# Patient Record
Sex: Female | Born: 1990 | Race: White | Hispanic: No | Marital: Single
Health system: Midwestern US, Community
[De-identification: ages and names within clinical notes are randomized; demographics above are authoritative.]

## PROBLEM LIST (undated history)

## (undated) DIAGNOSIS — N946 Dysmenorrhea, unspecified: Secondary | ICD-10-CM

## (undated) DIAGNOSIS — F1120 Opioid dependence, uncomplicated: Secondary | ICD-10-CM

## (undated) DIAGNOSIS — Z0000 Encounter for general adult medical examination without abnormal findings: Secondary | ICD-10-CM

## (undated) DIAGNOSIS — Z720 Tobacco use: Secondary | ICD-10-CM

## (undated) DIAGNOSIS — R002 Palpitations: Secondary | ICD-10-CM

## (undated) DIAGNOSIS — F1910 Other psychoactive substance abuse, uncomplicated: Secondary | ICD-10-CM

## (undated) DIAGNOSIS — F329 Major depressive disorder, single episode, unspecified: Principal | ICD-10-CM

## (undated) DIAGNOSIS — F419 Anxiety disorder, unspecified: Principal | ICD-10-CM

## (undated) HISTORY — DX: Dysmenorrhea, unspecified: N94.6

## (undated) HISTORY — DX: Anxiety disorder, unspecified: F41.9

## (undated) HISTORY — DX: Palpitations: R00.2

## (undated) HISTORY — DX: Encounter for general adult medical examination without abnormal findings: Z00.00

## (undated) HISTORY — DX: Tobacco use: Z72.0

## (undated) HISTORY — DX: Major depressive disorder, single episode, unspecified: F32.9

---
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
## 2012-01-08 ENCOUNTER — Other Ambulatory Visit: Payer: Self-pay | Admitting: Family Medicine

## 2012-01-09 ENCOUNTER — Telehealth: Payer: Self-pay | Admitting: Family Medicine

## 2012-01-09 DIAGNOSIS — G8929 Other chronic pain: Secondary | ICD-10-CM

## 2012-01-09 NOTE — Telephone Encounter (Signed)
OK to give refill on all 3 but not til the end of the month. She should not be out yet. Can fill any time after 10/29. If she is in that much pain and needs more she should consider coming back in for further evaluation and changes to meds or referral as needed

## 2012-01-09 NOTE — Telephone Encounter (Signed)
I left a detailed message on patients cell phone

## 2012-01-09 NOTE — Telephone Encounter (Signed)
Please advise refills? Oxy last RX was wrote on 12-20-11 quantity 20 with 0 refills, Flexeril  last RX was wrote on 12-20-11 quantity 30 with 0 refills, Mobic  last RX was wrote on 12-20-11 quantity 30 with 0 refills

## 2012-01-09 NOTE — Telephone Encounter (Signed)
Please advise Oxy, Flexeril, and Mobic refill? Last RX

## 2012-01-09 NOTE — Telephone Encounter (Signed)
Patient will pick up her oxycodone Rx on 01/15/12. Patient will pick up the muscle relaxer and the antiinflammatory at [facility] on the same day.

## 2012-01-14 MED ORDER — OXYCODONE-ACETAMINOPHEN 5-325 MG PO TABS: 1.0000 | ORAL_TABLET | Freq: Every day | ORAL | Status: DC | PRN

## 2012-01-14 NOTE — Telephone Encounter (Signed)
RX is printed and put up front

## 2012-01-15 MED ORDER — MELOXICAM 15 MG PO TABS: 15.0000 mg | ORAL_TABLET | Freq: Every day | ORAL | Status: DC

## 2012-01-15 MED ORDER — CYCLOBENZAPRINE HCL 10 MG PO TABS: ORAL_TABLET | ORAL | Status: DC

## 2012-01-15 NOTE — Addendum Note (Signed)
Addended by: Court Joy on: 01/15/2012 04:01 PM   Modules accepted: Orders

## 2012-01-21 ENCOUNTER — Ambulatory Visit (INDEPENDENT_AMBULATORY_CARE_PROVIDER_SITE_OTHER): Payer: BC Managed Care – HMO | Admitting: Family Medicine

## 2012-01-21 ENCOUNTER — Encounter: Payer: Self-pay | Admitting: Family Medicine

## 2012-01-21 ENCOUNTER — Telehealth: Payer: Self-pay | Admitting: Family Medicine

## 2012-01-21 ENCOUNTER — Other Ambulatory Visit: Payer: Self-pay | Admitting: Family Medicine

## 2012-01-21 VITALS — BP 111/75 | HR 65 | Temp 98.0°F | Ht 64.0 in | Wt 134.8 lb

## 2012-01-21 DIAGNOSIS — R002 Palpitations: Secondary | ICD-10-CM

## 2012-01-21 DIAGNOSIS — F329 Major depressive disorder, single episode, unspecified: Secondary | ICD-10-CM

## 2012-01-21 DIAGNOSIS — F341 Dysthymic disorder: Secondary | ICD-10-CM

## 2012-01-21 DIAGNOSIS — F418 Other specified anxiety disorders: Secondary | ICD-10-CM

## 2012-01-21 DIAGNOSIS — M549 Dorsalgia, unspecified: Secondary | ICD-10-CM

## 2012-01-21 MED ORDER — PAROXETINE HCL 20 MG PO TABS: 20.0000 mg | ORAL_TABLET | ORAL | Status: DC
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
## 2012-01-21 NOTE — Assessment & Plan Note (Signed)
Patient under increased stress lately, did initially get benefit from the Paroxetine but now needs it increased. Will try 20 mg daily. She has Alprazolam to use prn but she is reminded she can only use it very infrequently. She is accompanied by her grandmother today and her grandmother notes she has had recent personality changes with an episode of a very angry outburst last week. They are given the name of psychiatrist Dr Emerson Monte to seek further care from due to the family's concern.

## 2012-01-21 NOTE — Assessment & Plan Note (Signed)
Uses Meloxicam in am and Cyclobenzaprine qhs then needs Oxycodone infrequently. Only had a prescription for 20 tabs but unfortunately her mother called today concerned she may be taking the meds to excess or diverting them. Patient is asked to sign a drug contract and take a drug test today. Mom says some of the patient's brothers ADHD meds have gone missing recently. Drug screen will show if patient is taking Vyvanse when it is not prescribed to her. We will see her in follow up in 2 weeks or sooner as needed.

## 2012-01-21 NOTE — Telephone Encounter (Signed)
Patient's mother called concerned that the patient is possibly diverting her medications. Mom notes that she's had her other children's medications from other physicians go missing recently. By that is missing from both of her brothers prescriptions. She has become belligerent and argumentative lately. Mom is having concerns that she use either Sarna medications with her new boyfriend were taking him excessively. Patient has agreed to come in to discuss her medications this afternoon. We will have her do a drug test and have to stop all controlled substances at this time. She'll be offered a referral to orthopedics versus pain management if she needs further intervention.

## 2012-01-22 ENCOUNTER — Encounter: Payer: Self-pay | Admitting: Family Medicine

## 2012-01-22 ENCOUNTER — Telehealth: Payer: Self-pay | Admitting: Family Medicine

## 2012-01-22 DIAGNOSIS — R002 Palpitations: Secondary | ICD-10-CM

## 2012-01-22 HISTORY — DX: Palpitations: R00.2

## 2012-01-22 LAB — PRESCRIPTION ABUSE MONITORING 10P, URINE
Amphetamine/Meth: NEGATIVE ng/mL
Barbiturate Screen, Urine: NEGATIVE ng/mL
Benzodiazepine Screen, Urine: NEGATIVE ng/mL
Cannabinoid Scrn, Ur: NEGATIVE ng/mL
Creatinine, Urine: 96.13 mg/dL (ref >20.0)
Oxycodone Screen, Ur: NEGATIVE ng/mL

## 2012-01-22 NOTE — Telephone Encounter (Signed)
Message copied by Bradd Canary on Tue Jan 22, 2012  4:52 PM ------      Message from: Carmelia Bake      Created: Tue Jan 22, 2012  1:50 PM       Please call Andee Lineman [phone]. Advised patient that the physician does not typically contact patient.

## 2012-01-22 NOTE — Telephone Encounter (Signed)
Spoke with patient's mother, Heather Shepherd and reassured her that patient was cooperative with testing and signing a contract. We will continue to keep her involved as long as we are able and patient allows [place] to do so.

## 2012-01-22 NOTE — Assessment & Plan Note (Deleted)
No c/o at present time

## 2012-01-23 LAB — OPIATES/OPIOIDS (LC/MS-MS)
Codeine Urine: NEGATIVE ng/mL
Heroin (6-AM), UR: NEGATIVE ng/mL
Hydrocodone: 140 ng/mL — ABNORMAL HIGH
Norhydrocodone, Ur: 884 ng/mL — ABNORMAL HIGH

## 2012-01-23 NOTE — Telephone Encounter (Signed)
Left a message for pt to return my call. 

## 2012-01-23 NOTE — Telephone Encounter (Signed)
Patient called back and stated that she took a Xanax 1 day a week or two ago (doesn't take them like "that"), Oxy on the 29th and 30th, and pt states she told MD that she would see Vicodin on her drug test because she has taken some of those from an old prescription she has (these don't make her sleepy).

## 2012-01-23 NOTE — Progress Notes (Signed)
Quick Note:  Patient Informed and voiced understanding ______ 

## 2012-01-23 NOTE — Telephone Encounter (Signed)
thanks

## 2012-01-23 NOTE — Telephone Encounter (Signed)
Pt also stated she would bring her bottles back if she needs to.

## 2012-02-01 ENCOUNTER — Ambulatory Visit (INDEPENDENT_AMBULATORY_CARE_PROVIDER_SITE_OTHER): Payer: BC Managed Care – HMO | Admitting: Family Medicine

## 2012-02-01 ENCOUNTER — Encounter: Payer: Self-pay | Admitting: Family Medicine

## 2012-02-01 ENCOUNTER — Ambulatory Visit: Payer: BC Managed Care – HMO | Admitting: Family Medicine

## 2012-02-01 VITALS — BP 127/79 | HR 82 | Temp 98.6°F | Ht 64.0 in | Wt 135.4 lb

## 2012-02-01 DIAGNOSIS — M549 Dorsalgia, unspecified: Secondary | ICD-10-CM

## 2012-02-01 DIAGNOSIS — F341 Dysthymic disorder: Secondary | ICD-10-CM

## 2012-02-01 DIAGNOSIS — F419 Anxiety disorder, unspecified: Secondary | ICD-10-CM

## 2012-02-01 DIAGNOSIS — G8929 Other chronic pain: Secondary | ICD-10-CM

## 2012-02-01 MED ORDER — MELOXICAM 15 MG PO TABS: 15.0000 mg | ORAL_TABLET | Freq: Every day | ORAL | Status: DC

## 2012-02-01 NOTE — Patient Instructions (Signed)

## 2012-02-03 ENCOUNTER — Encounter: Payer: Self-pay | Admitting: Family Medicine

## 2012-02-03 NOTE — Progress Notes (Signed)
Patient ID: Hillari Ishida, female   DOB: 12/17/1990, 21 y.o.   MRN: [id number] Jemiya Fleurant [id number] 05/24/1990 02/03/2012      Progress Note-Follow Up  Subjective  Chief Complaint  Chief Complaint  Patient presents with  . Follow-up    6 week    HPI  Patient is a 21 year old Caucasian female who is in today accompanied by her mother. They have been able to pass things up and she is back home. She continues to struggle with chronic back pain but finds it tolerable the moment. Is using very few medications besides her anti-inflammatory. Also struggles with anxiety and depression which runs strongly in her family. Mom and grandmother are both being treated with medications. She did not pick up her increase Paroxetine dosing at this point due to financial reasons but is going to today. No new acute complaints. No recent illness, chest pain, palpitations, shortness of breath, anxiety attacks, GI or GU complaints. She is using her Oxycodone very infrequently and has recently gotten a new puppy whom she reports ate several of her meds facilitating a trip to the vet but no major health concerns identified. Is not asking for futher refills today  Past Medical History  Diagnosis Date  . Dysmenorrhea   . Anxiety and depression 11/08/2011  . Preventative health care 11/11/2011  . Tobacco abuse 11/11/2011  . Palpitations 01/22/2012    No past surgical history on file.  Family History  Problem Relation Age of Onset  . Hyperlipidemia Mother   . Hyperlipidemia Father   . Diabetes Maternal Grandmother   . Heart disease Maternal Grandfather     triple bypass  . Hyperlipidemia Maternal Grandfather   . Hypertension Maternal Grandfather   . Heart attack Maternal Grandfather     History   Social History  . Marital Status: Single    Spouse Name: N/A    Number of Children: N/A  . Years of Education: N/A   Occupational History  . Not on file.   Social History Main Topics  . Smoking  status: Current Every Day Smoker -- 1.0 packs/day for 2 years  . Smokeless tobacco: Never Used  . Alcohol Use: Yes     Comment: occasionally  . Drug Use: No  . Sexually Active: Yes -- Female partner(s)   Other Topics Concern  . Not on file   Social History Narrative  . No narrative on file    Current Outpatient Prescriptions on File Prior to Visit  Medication Sig Dispense Refill  . acetaminophen (TYLENOL) 500 MG tablet Take 1,500 mg by mouth every 6 (six) hours as needed. For pain       . cyclobenzaprine (FLEXERIL) 10 MG tablet 1/2 to 1 tab by mouth at bedtime as needed for pain  30 tablet  0  . oxyCODONE-acetaminophen (ROXICET) 5-325 MG per tablet Take 1 tablet by mouth daily as needed for pain.  20 tablet  0  . PARoxetine (PAXIL) 20 MG tablet Take 1 tablet (20 mg total) by mouth every morning.  30 tablet  1    Allergies  Allergen Reactions  . Ibuprofen Hives    Review of Systems  Review of Systems  Constitutional: Negative for fever and malaise/fatigue.  HENT: Negative for congestion.   Eyes: Negative for discharge.  Respiratory: Negative for shortness of breath.   Cardiovascular: Negative for chest pain, palpitations and leg swelling.  Gastrointestinal: Negative for nausea, abdominal pain and diarrhea.  Genitourinary: Negative for dysuria.  Musculoskeletal: Positive for back  pain. Negative for falls.  Skin: Negative for rash.  Neurological: Negative for loss of consciousness and headaches.  Endo/Heme/Allergies: Negative for polydipsia.  Psychiatric/Behavioral: Positive for depression. Negative for suicidal ideas. The patient is nervous/anxious. The patient does not have insomnia.     Objective  BP 127/79  Pulse 82  Temp 98.6 F (37 C) (Temporal)  Ht 5\' 4"  (1.626 m)  Wt 135 lb 6.4 oz (61.417 kg)  BMI 23.24 kg/m2  SpO2 99%  LMP 01/07/2012  Physical Exam  Physical Exam  Constitutional: She is oriented to person, place, and time and well-developed,  well-nourished, and in no distress. No distress.  HENT:  Head: Normocephalic and atraumatic.  Eyes: Conjunctivae normal are normal.  Neck: Neck supple. No thyromegaly present.  Cardiovascular: Normal rate, regular rhythm and normal heart sounds.   No murmur heard. Pulmonary/Chest: Effort normal and breath sounds normal. She has no wheezes.  Abdominal: She exhibits no distension and no mass.  Musculoskeletal: She exhibits no edema.  Lymphadenopathy:    She has no cervical adenopathy.  Neurological: She is alert and oriented to person, place, and time.  Skin: Skin is warm and dry. No rash noted. She is not diaphoretic.  Psychiatric: Memory, affect and judgment normal.     Assessment & Plan  Back pain Doing well, Not requesting any refills on pain meds. Has passed drug test and is encouraged to consider Yoga and stretching.   Anxiety and depression Has not increased her Paroxetine to 20 mg daily but agrees to do so now.

## 2012-02-03 NOTE — Assessment & Plan Note (Signed)
Has not increased her Paroxetine to 20 mg daily but agrees to do so now.

## 2012-02-03 NOTE — Assessment & Plan Note (Signed)
Doing well, Not requesting any refills on pain meds. Has passed drug test and is encouraged to consider Yoga and stretching.

## 2012-02-11 ENCOUNTER — Other Ambulatory Visit: Payer: Self-pay

## 2012-02-11 DIAGNOSIS — F418 Other specified anxiety disorders: Secondary | ICD-10-CM

## 2012-02-18 ENCOUNTER — Telehealth: Payer: Self-pay | Admitting: Family Medicine

## 2012-02-18 DIAGNOSIS — M549 Dorsalgia, unspecified: Secondary | ICD-10-CM

## 2012-02-18 MED ORDER — CYCLOBENZAPRINE HCL 10 MG PO TABS: ORAL_TABLET | ORAL | Status: DC

## 2012-02-18 MED ORDER — OXYCODONE-ACETAMINOPHEN 5-325 MG PO TABS: 1.0000 | ORAL_TABLET | Freq: Every day | ORAL | Status: DC | PRN

## 2012-02-18 MED ORDER — MELOXICAM 15 MG PO TABS: 15.0000 mg | ORAL_TABLET | Freq: Every day | ORAL | Status: DC

## 2012-02-18 NOTE — Telephone Encounter (Signed)
Patient came in to pick these up

## 2012-02-18 NOTE — Telephone Encounter (Signed)
It looks like all of her meds except her Paxil are due anyway, has not had most of them since October. She can have the Flexeril (which is the muscle relaxer). 1 tab po daily prn pain, can cause sedation, #30 w/o rf til seen in follow up for Paxil change and can have Meloxicam 15 mg 1 tab po daily prn pain with food, #30 3 rf. Oxycodone refill with same sig, Disp #15 but remind her I expect this to last a while because this is a med only to use when in severe pain

## 2012-02-18 NOTE — Telephone Encounter (Signed)
Please advise 

## 2012-02-18 NOTE — Telephone Encounter (Signed)
Patient's dog ate her medicine, she mentioned it to Dr Abner Greenspan in her last OV. Patient needs refills of her muscle relaxer, oxycodone, flexeril. She is off work today so she can come by the office to pick it up. Please call when ready.

## 2012-02-25 ENCOUNTER — Ambulatory Visit: Payer: BC Managed Care – HMO | Admitting: Family Medicine

## 2012-04-02 ENCOUNTER — Telehealth: Payer: Self-pay

## 2012-04-02 NOTE — Telephone Encounter (Signed)
Pt left a message to return her call. I left a message at [phone] to return my call

## 2012-04-03 ENCOUNTER — Other Ambulatory Visit: Payer: Self-pay | Admitting: Family Medicine

## 2012-04-03 DIAGNOSIS — F418 Other specified anxiety disorders: Secondary | ICD-10-CM

## 2012-04-03 DIAGNOSIS — G8929 Other chronic pain: Secondary | ICD-10-CM

## 2012-04-03 NOTE — Telephone Encounter (Signed)
Patient is out of all of her meds. Advised patient that Rx takes 48 hours. She has promised to call 1 week in advance in future. Please call patient's boyfriends cell when ready to pu.

## 2012-04-04 MED ORDER — MELOXICAM 15 MG PO TABS: 15.0000 mg | ORAL_TABLET | Freq: Every day | ORAL | Status: DC

## 2012-04-04 MED ORDER — OXYCODONE-ACETAMINOPHEN 5-325 MG PO TABS: 1.0000 | ORAL_TABLET | Freq: Every day | ORAL | Status: DC | PRN

## 2012-04-04 MED ORDER — PAROXETINE HCL 20 MG PO TABS: 20.0000 mg | ORAL_TABLET | ORAL | Status: DC

## 2012-04-04 MED ORDER — CYCLOBENZAPRINE HCL 10 MG PO TABS: ORAL_TABLET | ORAL | Status: DC

## 2012-04-04 NOTE — Telephone Encounter (Signed)
Please advise refills?  Oxycodone last RX was 02-18-12 quantity 15 with 0 refills Flexeril  last RX was 02-18-12 quantity 30 with 0 refills Meloxicam  last RX was 02-18-12 quantity 30 with 3 refills? Pt should have refills? Paxil  last RX was 01-31-12 quantity 30 with 1 refills

## 2012-05-14 ENCOUNTER — Telehealth: Payer: Self-pay | Admitting: Family Medicine

## 2012-05-14 NOTE — Telephone Encounter (Signed)
She can have refill on Oxy and Flexeril

## 2012-05-14 NOTE — Telephone Encounter (Signed)
Please advise refills.  Last RX for Meloxicam was- 04-04-12 quantity 30 with 3 refills-- NOT TIME FOR REFILL  Last RX for Flexeril was 04-04-12 quantity 30 with 0 refill  Last RX for oxy was 04-04-12 quantity 15 with 0 refills

## 2012-05-15 MED ORDER — CYCLOBENZAPRINE HCL 10 MG PO TABS: ORAL_TABLET | ORAL | Status: DC

## 2012-05-15 MED ORDER — OXYCODONE-ACETAMINOPHEN 5-325 MG PO TABS: 1.0000 | ORAL_TABLET | Freq: Every day | ORAL | Status: DC | PRN

## 2012-05-15 NOTE — Telephone Encounter (Signed)
RX's printed and pt informed RX's are at [facility] office

## 2012-05-30 ENCOUNTER — Ambulatory Visit: Payer: BC Managed Care – HMO | Admitting: Family Medicine

## 2012-06-10 ENCOUNTER — Telehealth: Payer: Self-pay

## 2012-06-10 NOTE — Telephone Encounter (Signed)
Patients mom called asking where to take pt for detox? I informed pt to go to [facility] and checked with MD and she verbally agreed. Pts mother states she is getting ready to take her.

## 2012-06-16 ENCOUNTER — Telehealth: Payer: Self-pay | Admitting: Family Medicine

## 2012-06-16 NOTE — Telephone Encounter (Signed)
Please contact patient's mother Tues 06/17/12. Would not give any details as to why she is calling.

## 2012-06-17 NOTE — Telephone Encounter (Signed)
Spoke with patient's mother and informed that we need more general information to forward to provider as to what this is about, as provider is occupied with her patients in the office during regular business hours, caller refused, stating that she would like to speak directly w/provider as this was "a Personnel officer". Informed that message would be forwarded to provider/SLS

## 2012-07-01 NOTE — Telephone Encounter (Signed)
Patients mom called back saying that she wanted to inform Dr. 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 Mom says that patient is in a facility in [facility] and has been there since 06/13/12. She says that this is a 35 day program. Patients mom would like a callback regarding this.

## 2012-07-01 NOTE — Telephone Encounter (Signed)
Patient informed provider out of office until Thurs, 04.16.14, understood & agreed to wait on provider call/SLS

## 2012-07-02 ENCOUNTER — Other Ambulatory Visit: Payer: Self-pay | Admitting: Family Medicine

## 2012-07-02 NOTE — Telephone Encounter (Signed)
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 She is now in a rehab center in [place] for 35 days and then will return home for outpatient treatment. I have removed Oxycodone and Cyclobenzaprine from her medicaiton list so she cannot get refills

## 2012-07-18 ENCOUNTER — Telehealth: Payer: Self-pay

## 2012-07-18 NOTE — Telephone Encounter (Signed)
I called pts mother per MD's request to give information about the [facility] house in [place] for pts treatment after getting back from [place].  [facility]- [address] [place], [place] [address] [phone] ask for Faunt[facility].  [facility]- [address] [place], [place] [address] [phone].  If any questions pt can call [place] at [phone]

## 2014-01-24 ENCOUNTER — Emergency Department: Admit: 2014-01-24

## 2014-01-24 ENCOUNTER — Inpatient Hospital Stay: Admit: 2014-01-24 | Discharge: 2014-01-24

## 2014-01-24 MED ORDER — CYCLOBENZAPRINE HCL 10 MG PO TABS
10 MG | ORAL_TABLET | Freq: Three times a day (TID) | ORAL | Status: AC | PRN
Start: 2014-01-24 — End: 2014-01-31

## 2014-01-24 NOTE — ED Notes (Signed)
PT discharged at this time.  PT given instructions for follow up and prescriptions.  PT denies any needs or questions.  PT taken to discharge office.      Nira RetortLaney Beckett, RN  01/24/14 1504

## 2014-01-24 NOTE — ED Notes (Signed)
Bed: [facility] 04  Expected date:   Expected time:   Means of arrival:   Comments:
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
## 2015-05-09 DIAGNOSIS — F1111 Opioid abuse, in remission: Secondary | ICD-10-CM | POA: Insufficient documentation

## 2017-03-18 ENCOUNTER — Telehealth (HOSPITAL_COMMUNITY): Payer: Self-pay | Admitting: Licensed Clinical Social Worker

## 2017-03-18 NOTE — Telephone Encounter (Signed)
Called and spoke w/ guardian about pt starting CD-IOP when [facility].

## 2017-04-03 ENCOUNTER — Telehealth (HOSPITAL_COMMUNITY): Payer: Self-pay | Admitting: Licensed Clinical Social Worker

## 2017-04-03 NOTE — Telephone Encounter (Signed)
Counselor returned call from pt and pt scheduled CCA w/ desire to enter CD-IOP on Monday 1/21.

## 2017-04-05 ENCOUNTER — Ambulatory Visit: Payer: Self-pay | Admitting: Family Medicine

## 2017-04-05 DIAGNOSIS — Z0289 Encounter for other administrative examinations: Secondary | ICD-10-CM

## 2017-04-08 ENCOUNTER — Ambulatory Visit (HOSPITAL_COMMUNITY): Payer: Self-pay | Admitting: Licensed Clinical Social Worker

## 2017-04-24 ENCOUNTER — Ambulatory Visit (INDEPENDENT_AMBULATORY_CARE_PROVIDER_SITE_OTHER): Payer: BLUE CROSS/BLUE SHIELD | Admitting: Licensed Clinical Social Worker

## 2017-04-24 DIAGNOSIS — F1020 Alcohol dependence, uncomplicated: Secondary | ICD-10-CM | POA: Diagnosis not present

## 2017-04-24 DIAGNOSIS — F1120 Opioid dependence, uncomplicated: Secondary | ICD-10-CM

## 2017-04-25 ENCOUNTER — Encounter (HOSPITAL_COMMUNITY): Payer: Self-pay | Admitting: Licensed Clinical Social Worker
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
## 2017-05-06 ENCOUNTER — Telehealth (HOSPITAL_COMMUNITY): Payer: Self-pay | Admitting: Licensed Clinical Social Worker

## 2017-05-06 ENCOUNTER — Telehealth (HOSPITAL_COMMUNITY): Payer: Self-pay

## 2017-05-06 NOTE — Telephone Encounter (Signed)
Patient called stating that she had been waiting to hear back concerning IOP. Please call and advise.  Thanks

## 2017-05-06 NOTE — Telephone Encounter (Signed)
Advised pt to sign a ROI at her Suboxone provider [facility] w/ Kip Corrington to allow CD-IOP to speak w/ them concerning her upcoming admission in CD-IOP. Pt verbalized understanding and will contact this writer when ROI is complete.

## 2017-05-07 ENCOUNTER — Telehealth (HOSPITAL_COMMUNITY): Payer: Self-pay | Admitting: Psychology

## 2017-07-17 ENCOUNTER — Emergency Department (HOSPITAL_BASED_OUTPATIENT_CLINIC_OR_DEPARTMENT_OTHER)
Admission: EM | Admit: 2017-07-17 | Discharge: 2017-07-17 | Disposition: A | Discharge status: Home / Self Care | Payer: Worker's Compensation | Attending: Emergency Medicine | Admitting: Emergency Medicine

## 2017-07-17 ENCOUNTER — Encounter (HOSPITAL_BASED_OUTPATIENT_CLINIC_OR_DEPARTMENT_OTHER): Payer: Self-pay | Admitting: Emergency Medicine

## 2017-07-17 ENCOUNTER — Other Ambulatory Visit: Payer: Self-pay

## 2017-07-17 DIAGNOSIS — S71011A Laceration without foreign body, right hip, initial encounter: Secondary | ICD-10-CM | POA: Insufficient documentation

## 2017-07-17 DIAGNOSIS — Y288XXA Contact with other sharp object, undetermined intent, initial encounter: Secondary | ICD-10-CM | POA: Diagnosis not present

## 2017-07-17 DIAGNOSIS — S76021A Laceration of muscle, fascia and tendon of right hip, initial encounter: Secondary | ICD-10-CM | POA: Diagnosis not present

## 2017-07-17 DIAGNOSIS — Y990 Civilian activity done for income or pay: Secondary | ICD-10-CM | POA: Diagnosis not present

## 2017-07-17 DIAGNOSIS — Z79899 Other long term (current) drug therapy: Secondary | ICD-10-CM | POA: Diagnosis not present

## 2017-07-17 DIAGNOSIS — F17210 Nicotine dependence, cigarettes, uncomplicated: Secondary | ICD-10-CM | POA: Insufficient documentation

## 2017-07-17 DIAGNOSIS — S79921A Unspecified injury of right thigh, initial encounter: Secondary | ICD-10-CM | POA: Diagnosis present

## 2017-07-17 DIAGNOSIS — Y9289 Other specified places as the place of occurrence of the external cause: Secondary | ICD-10-CM | POA: Diagnosis not present

## 2017-07-17 DIAGNOSIS — Y9389 Activity, other specified: Secondary | ICD-10-CM | POA: Insufficient documentation

## 2017-07-17 HISTORY — DX: Opioid dependence, uncomplicated: F11.20

## 2017-07-17 HISTORY — DX: Other psychoactive substance abuse, uncomplicated: F19.10

## 2017-07-17 MED ORDER — LIDOCAINE-EPINEPHRINE-TETRACAINE (LET) SOLUTION
3.0000 mL | Freq: Once | NASAL | Status: AC
  Administered 2017-07-17: 3 mL via TOPICAL
  Filled 2017-07-17: qty 3

## 2017-07-17 NOTE — ED Triage Notes (Signed)
Pt has co-worker with her and they are waiting to hear from supervisor to see if pt needs UDS for workman's comp. Will inform staff.

## 2017-07-17 NOTE — ED Provider Notes (Signed)
[facility] EMERGENCY DEPARTMENT Provider Note   CSN: [id number] Arrival date & time: 07/17/17  0127     History   Chief Complaint Chief Complaint  Patient presents with  . Extremity Laceration    HPI Heather Shepherd is a 27 y.o. female.  The history is provided by the patient.  Laceration   The incident occurred 3 to 5 hours ago. Pain location: right proximal thigh. The laceration is 3 cm in size. The laceration mechanism was a a metal edge. The pain is moderate. The pain has been constant since onset. She reports no foreign bodies present. Her tetanus status is UTD.  Box cutter visualized intact.  Past Medical History:  Diagnosis Date  . Anxiety and depression 11/08/2011  . Dysmenorrhea   . Opioid dependence on agonist therapy (HCC)   . Palpitations 01/22/2012  . Preventative health care 11/11/2011  . Tobacco abuse 11/11/2011    Patient Active Problem List   Diagnosis Date Noted  . Back pain 12/24/2011  . Preventative health care 11/11/2011  . Tobacco abuse 11/11/2011  . Anxiety and depression 11/08/2011  . Dysmenorrhea     History reviewed. No pertinent surgical history.   OB History   None      Home Medications    Prior to Admission medications   Medication Sig Start Date End Date Taking? Authorizing Provider  buprenorphine-naloxone (SUBOXONE) 8-2 mg SUBL SL tablet Place 2 tablets under the tongue daily.   Yes [provider]  mirtazapine (REMERON) 30 MG tablet Take 30 mg by mouth at bedtime.   Yes [provider]  venlafaxine XR (EFFEXOR-XR) 150 MG 24 hr capsule Take 150 mg by mouth daily with breakfast.   Yes [provider]    Family History Family History  Problem Relation Age of Onset  . Hyperlipidemia Mother   . Hyperlipidemia Father   . Diabetes Maternal Grandmother   . Heart disease Maternal Grandfather        triple bypass  . Hyperlipidemia Maternal Grandfather   . Hypertension Maternal Grandfather   .  Heart attack Maternal Grandfather     Social History Social History   Tobacco Use  . Smoking status: Current Every Day Smoker    Packs/day: 1.00    Years: 2.00    Pack years: 2.00  . Smokeless tobacco: Never Used  Substance Use Topics  . Alcohol use: Yes    Comment: occasionally  . Drug use: No     Allergies   Ibuprofen   Review of Systems Review of Systems  Constitutional: Negative for fever.  Respiratory: Negative for shortness of breath.   Cardiovascular: Negative for chest pain.  Gastrointestinal: Negative for constipation.  Skin: Positive for wound.  All other systems reviewed and are negative.    Physical Exam Updated Vital Signs BP 131/72 (BP Location: Left Arm)   Pulse 73   Temp 98.3 F (36.8 C) (Oral)   Resp 18   Ht  (1.626 m)   Wt 79.4 kg (175 lb)   SpO2 98%   BMI 30.04 kg/m   Physical Exam  Constitutional: She is oriented to person, place, and time. She appears well-developed and well-nourished. No distress.  HENT:  Head: Normocephalic and atraumatic.  Mouth/Throat: No oropharyngeal exudate.  Eyes: Conjunctivae and EOM are normal.  Neck: Normal range of motion. Neck supple.  Cardiovascular: Normal rate, regular rhythm, normal heart sounds and intact distal pulses.  Pulmonary/Chest: Effort normal and breath sounds normal. No stridor. She  has no wheezes. She has no rales.  Abdominal: Soft. Bowel sounds are normal. She exhibits no mass. There is no tenderness. There is no guarding.  Musculoskeletal: Normal range of motion.  Neurological: She is alert and oriented to person, place, and time. She displays normal reflexes.  Skin: Skin is warm and dry. Capillary refill takes less than 2 seconds.     Psychiatric: She has a normal mood and affect.     ED Treatments / Results  Labs (all labs ordered are listed, but only abnormal results are displayed) Labs Reviewed - No data to display  EKG None  Radiology No results  found.  Procedures .Marland KitchenLaceration Repair Date/Time: 07/17/2017 4:06 AM Performed by: Cy Blamer, MD Authorized by: Cy Blamer, MD   Consent:    Consent obtained:  Verbal   Consent given by:  Patient   Risks discussed:  Infection, need for additional repair, nerve damage, poor wound healing, poor cosmetic result, pain, retained foreign body, tendon damage and vascular damage   Alternatives discussed:  No treatment Anesthesia (see MAR for exact dosages):    Anesthesia method:  Topical application   Topical anesthetic:  LET Laceration details:    Location: right thigh medial.   Length (cm):  3   Depth (mm):  1 Repair type:    Repair type:  Simple Pre-procedure details:    Preparation:  Patient was prepped and draped in usual sterile fashion Exploration:    Hemostasis achieved with:  Direct pressure   Wound exploration: wound explored through full range of motion     Wound extent: no areolar tissue violation noted, no fascia violation noted, no foreign bodies/material noted, no muscle damage noted, no nerve damage noted, no tendon damage noted, no underlying fracture noted and no vascular damage noted     Contaminated: no   Treatment:    Area cleansed with:  Betadine and Hibiclens   Amount of cleaning:  Standard   Irrigation solution:  Sterile saline Skin repair:    Repair method:  Staples   Number of staples:  4 Approximation:    Approximation:  Close Post-procedure details:    Dressing:  Antibiotic ointment and sterile dressing   Patient tolerance of procedure:  Tolerated well, no immediate complications   (including critical care time)  Medications Ordered in ED Medications  lidocaine-EPINEPHrine-tetracaine (LET) solution (3 mLs Topical Given 07/17/17 0313)      Final Clinical Impressions(s) / ED Diagnoses   Final diagnoses:  Thigh laceration involving tendon, right, initial encounter    Staple removal in 10 days at urgent care.  Wound care instructions  given.    Return for weakness, numbness, changes in vision or speech, fevers >100.4 unrelieved by medication, shortness of breath, intractable vomiting, or diarrhea, abdominal pain, Inability to tolerate liquids or food, cough, altered mental status or any concerns. No signs of systemic illness or infection. The patient is nontoxic-appearing on exam and vital signs are within normal limits.   I have reviewed the triage vital signs and the nursing notes. Pertinent labs &imaging results that were available during my care of the patient were reviewed by me and considered in my medical decision making (see chart for details).  After history, exam, and medical workup I feel the patient has been appropriately medically screened and is safe for discharge home. Pertinent diagnoses were discussed with the patient. Patient was given return precautions.     Lakitha Gordy, MD 07/17/17 1191

## 2017-07-17 NOTE — ED Triage Notes (Signed)
Pt has laceration from right thigh from box cutter at work.

## 2017-07-17 NOTE — ED Triage Notes (Signed)
Pt has dressing on wound, bleeding controlled.

## 2017-07-17 NOTE — ED Notes (Signed)
Pt has 1.5 inch diagonal laceration to right inner thigh, well approximated, dressing changed and reapplied

## 2021-01-13 ENCOUNTER — Inpatient Hospital Stay (HOSPITAL_COMMUNITY)
Admission: EM | Admit: 2021-01-13 | Discharge: 2021-01-17 | DRG: 894 | Discharge status: Against Medical Advice | Payer: Medicaid Other | Attending: Pulmonary Disease | Admitting: Pulmonary Disease

## 2021-01-13 ENCOUNTER — Emergency Department (HOSPITAL_COMMUNITY): Payer: Medicaid Other

## 2021-01-13 ENCOUNTER — Encounter (HOSPITAL_COMMUNITY): Payer: Self-pay | Admitting: Emergency Medicine

## 2021-01-13 DIAGNOSIS — E876 Hypokalemia: Secondary | ICD-10-CM | POA: Diagnosis present

## 2021-01-13 DIAGNOSIS — F1123 Opioid dependence with withdrawal: Secondary | ICD-10-CM | POA: Diagnosis present

## 2021-01-13 DIAGNOSIS — F1193 Opioid use, unspecified with withdrawal: Secondary | ICD-10-CM

## 2021-01-13 DIAGNOSIS — F10939 Alcohol use, unspecified with withdrawal, unspecified: Secondary | ICD-10-CM | POA: Diagnosis present

## 2021-01-13 DIAGNOSIS — F1910 Other psychoactive substance abuse, uncomplicated: Secondary | ICD-10-CM | POA: Diagnosis present

## 2021-01-13 DIAGNOSIS — R7401 Elevation of levels of liver transaminase levels: Secondary | ICD-10-CM | POA: Diagnosis present

## 2021-01-13 DIAGNOSIS — F1990 Other psychoactive substance use, unspecified, uncomplicated: Secondary | ICD-10-CM | POA: Diagnosis present

## 2021-01-13 DIAGNOSIS — F10231 Alcohol dependence with withdrawal delirium: Principal | ICD-10-CM | POA: Diagnosis present

## 2021-01-13 DIAGNOSIS — E871 Hypo-osmolality and hyponatremia: Secondary | ICD-10-CM | POA: Diagnosis not present

## 2021-01-13 DIAGNOSIS — D7589 Other specified diseases of blood and blood-forming organs: Secondary | ICD-10-CM | POA: Diagnosis present

## 2021-01-13 DIAGNOSIS — F10931 Alcohol use, unspecified with withdrawal delirium: Secondary | ICD-10-CM

## 2021-01-13 DIAGNOSIS — Z8249 Family history of ischemic heart disease and other diseases of the circulatory system: Secondary | ICD-10-CM

## 2021-01-13 DIAGNOSIS — Z20822 Contact with and (suspected) exposure to covid-19: Secondary | ICD-10-CM | POA: Diagnosis present

## 2021-01-13 DIAGNOSIS — F17210 Nicotine dependence, cigarettes, uncomplicated: Secondary | ICD-10-CM | POA: Diagnosis present

## 2021-01-13 DIAGNOSIS — D72819 Decreased white blood cell count, unspecified: Secondary | ICD-10-CM | POA: Diagnosis present

## 2021-01-13 DIAGNOSIS — D6959 Other secondary thrombocytopenia: Secondary | ICD-10-CM | POA: Diagnosis present

## 2021-01-13 DIAGNOSIS — R079 Chest pain, unspecified: Secondary | ICD-10-CM

## 2021-01-13 DIAGNOSIS — D539 Nutritional anemia, unspecified: Secondary | ICD-10-CM | POA: Diagnosis present

## 2021-01-13 DIAGNOSIS — Z833 Family history of diabetes mellitus: Secondary | ICD-10-CM

## 2021-01-13 DIAGNOSIS — Z5329 Procedure and treatment not carried out because of patient's decision for other reasons: Secondary | ICD-10-CM | POA: Diagnosis present

## 2021-01-13 DIAGNOSIS — I959 Hypotension, unspecified: Secondary | ICD-10-CM | POA: Diagnosis present

## 2021-01-13 DIAGNOSIS — Z886 Allergy status to analgesic agent status: Secondary | ICD-10-CM

## 2021-01-13 DIAGNOSIS — Z781 Physical restraint status: Secondary | ICD-10-CM

## 2021-01-13 DIAGNOSIS — Z83438 Family history of other disorder of lipoprotein metabolism and other lipidemia: Secondary | ICD-10-CM

## 2021-01-13 LAB — RESP PANEL BY RT-PCR (FLU A&B, COVID) ARPGX2
Influenza A by PCR: NEGATIVE
Influenza B by PCR: NEGATIVE
SARS Coronavirus 2 by RT PCR: NEGATIVE

## 2021-01-13 LAB — COMPREHENSIVE METABOLIC PANEL
ALT: 95 U/L — ABNORMAL HIGH (ref 0–44)
AST: 240 U/L — ABNORMAL HIGH (ref 15–41)
Albumin: 4.4 g/dL (ref 3.5–5.0)
Alkaline Phosphatase: 90 U/L (ref 38–126)
Anion gap: 19 — ABNORMAL HIGH (ref 5–15)
BUN: 7 mg/dL (ref 6–20)
CO2: 23 mmol/L (ref 22–32)
Calcium: 9 mg/dL (ref 8.9–10.3)
Chloride: 95 mmol/L — ABNORMAL LOW (ref 98–111)
Creatinine, Ser: 0.75 mg/dL (ref 0.44–1.00)
GFR, Estimated: >60 mL/min (ref >60)
Glucose, Bld: 168 mg/dL — ABNORMAL HIGH (ref 70–99)
Potassium: 3.1 mmol/L — ABNORMAL LOW (ref 3.5–5.1)
Sodium: 137 mmol/L (ref 135–145)
Total Bilirubin: 2.3 mg/dL — ABNORMAL HIGH (ref 0.3–1.2)
Total Protein: 7.8 g/dL (ref 6.5–8.1)

## 2021-01-13 LAB — CBC
HCT: 38.5 % (ref 36.0–46.0)
Hemoglobin: 13.2 g/dL (ref 12.0–15.0)
MCH: 36.9 pg — ABNORMAL HIGH (ref 26.0–34.0)
MCHC: 34.3 g/dL (ref 30.0–36.0)
MCV: 107.5 fL — ABNORMAL HIGH (ref 80.0–100.0)
Platelets: 126 10*3/uL — ABNORMAL LOW (ref 150–400)
RBC: 3.58 MIL/uL — ABNORMAL LOW (ref 3.87–5.11)
RDW: 12.2 % (ref 11.5–15.5)
WBC: 4.4 10*3/uL (ref 4.0–10.5)
nRBC: 0 % (ref 0.0–0.2)

## 2021-01-13 LAB — I-STAT VENOUS BLOOD GAS, ED
Acid-Base Excess: 8 mmol/L — ABNORMAL HIGH (ref 0.0–2.0)
Bicarbonate: 30 mmol/L — ABNORMAL HIGH (ref 20.0–28.0)
Calcium, Ion: 1.01 mmol/L — ABNORMAL LOW (ref 1.15–1.40)
HCT: 33 % — ABNORMAL LOW (ref 36.0–46.0)
Hemoglobin: 11.2 g/dL — ABNORMAL LOW (ref 12.0–15.0)
O2 Saturation: 97 %
Potassium: 3.1 mmol/L — ABNORMAL LOW (ref 3.5–5.1)
Sodium: 136 mmol/L (ref 135–145)
TCO2: 31 mmol/L (ref 22–32)
pCO2, Ven: 33 mmHg — ABNORMAL LOW (ref 44.0–60.0)
pH, Ven: 7.567 — ABNORMAL HIGH (ref 7.250–7.430)
pO2, Ven: 81 mmHg — ABNORMAL HIGH (ref 32.0–45.0)

## 2021-01-13 LAB — ACETAMINOPHEN LEVEL: Acetaminophen (Tylenol), Serum: <10 ug/mL — ABNORMAL LOW (ref 10–30)

## 2021-01-13 LAB — I-STAT BETA HCG BLOOD, ED (MC, WL, AP ONLY): I-stat hCG, quantitative: <5 m[IU]/mL (ref <5)

## 2021-01-13 LAB — MAGNESIUM: Magnesium: 1.4 mg/dL — ABNORMAL LOW (ref 1.7–2.4)

## 2021-01-13 LAB — ETHANOL: Alcohol, Ethyl (B): <10 mg/dL (ref <10)

## 2021-01-13 LAB — TROPONIN I (HIGH SENSITIVITY)
Troponin I (High Sensitivity): 6 ng/L (ref <18)
Troponin I (High Sensitivity): 5 ng/L (ref <18)

## 2021-01-13 LAB — TSH: TSH: 1.085 u[IU]/mL (ref 0.350–4.500)

## 2021-01-13 LAB — LACTIC ACID, PLASMA: Lactic Acid, Venous: 1.5 mmol/L (ref 0.5–1.9)

## 2021-01-13 LAB — CBG MONITORING, ED: Glucose-Capillary: 193 mg/dL — ABNORMAL HIGH (ref 70–99)

## 2021-01-13 LAB — SALICYLATE LEVEL: Salicylate Lvl: <7 mg/dL — ABNORMAL LOW (ref 7.0–30.0)

## 2021-01-13 LAB — AMMONIA: Ammonia: 42 umol/L — ABNORMAL HIGH (ref 9–35)

## 2021-01-13 MED ORDER — MAGNESIUM SULFATE 2 GM/50ML IV SOLN
2.0000 g | Freq: Once | INTRAVENOUS | Status: AC
  Administered 2021-01-13: 2 g via INTRAVENOUS
  Filled 2021-01-13: qty 50

## 2021-01-13 MED ORDER — FOLIC ACID 1 MG PO TABS
1.0000 mg | ORAL_TABLET | Freq: Every day | ORAL | Status: DC
  Administered 2021-01-13: 1 mg via ORAL
  Filled 2021-01-13: qty 1

## 2021-01-13 MED ORDER — LACTATED RINGERS IV BOLUS
1000.0000 mL | Freq: Once | INTRAVENOUS | Status: AC
  Administered 2021-01-13: 1000 mL via INTRAVENOUS

## 2021-01-13 MED ORDER — THIAMINE HCL 100 MG/ML IJ SOLN
100.0000 mg | Freq: Every day | INTRAMUSCULAR | Status: DC
  Administered 2021-01-13: 100 mg via INTRAVENOUS
  Filled 2021-01-13: qty 2

## 2021-01-13 MED ORDER — THIAMINE HCL 100 MG PO TABS: 100.0000 mg | ORAL_TABLET | Freq: Every day | ORAL | Status: DC

## 2021-01-13 MED ORDER — LORAZEPAM 1 MG PO TABS
1.0000 mg | ORAL_TABLET | ORAL | Status: DC | PRN
  Administered 2021-01-13: 2 mg via ORAL
  Filled 2021-01-13: qty 2

## 2021-01-13 MED ORDER — LORAZEPAM 2 MG/ML IJ SOLN
1.0000 mg | INTRAMUSCULAR | Status: DC | PRN
  Administered 2021-01-13: 2 mg via INTRAVENOUS
  Administered 2021-01-13: 3 mg via INTRAVENOUS
  Administered 2021-01-13: 2 mg via INTRAVENOUS
  Administered 2021-01-13: 1 mg via INTRAVENOUS
  Administered 2021-01-14: 4 mg via INTRAVENOUS
  Filled 2021-01-13: qty 2
  Filled 2021-01-13 (×3): qty 1
  Filled 2021-01-13: qty 2

## 2021-01-13 MED ORDER — ADULT MULTIVITAMIN W/MINERALS CH
1.0000 | ORAL_TABLET | Freq: Every day | ORAL | Status: DC
  Administered 2021-01-13: 1 via ORAL
  Filled 2021-01-13: qty 1

## 2021-01-13 MED ORDER — POTASSIUM CHLORIDE 10 MEQ/100ML IV SOLN
10.0000 meq | Freq: Once | INTRAVENOUS | Status: AC
  Administered 2021-01-13: 10 meq via INTRAVENOUS
  Filled 2021-01-13: qty 100

## 2021-01-13 NOTE — ED Notes (Addendum)
EDP at [facility] 

## 2021-01-13 NOTE — ED Triage Notes (Signed)
Patient here for help with ETOH and opiate withdrawal. Patient states she drank 1/5 gallon of vodka every day, last drink yesterday; "one 40 bag of dope" every day, last time two days ago. Patient very anxious and shaking.

## 2021-01-13 NOTE — ED Notes (Signed)
Pt contaminated urine specimen again. Educated on importance of collection.
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
## 2021-01-13 NOTE — ED Notes (Signed)
Assisted to restroom with assistance. Pt defecated in urine specimen hat. Very unstable.

## 2021-01-13 NOTE — ED Provider Notes (Signed)
[facility] EMERGENCY DEPARTMENT Provider Note   CSN: [id number] Arrival date & time: 01/13/21  1611     History Chief Complaint  Patient presents with   Withdrawal    Heather Shepherd is a 30 y.o. female.  HPI  30 year old female with PMH significant for polysubstance abuse, tobacco use, and others as below who presents to the ED accompanied by her mother with complaints of EtOH withdrawal.  Patient reports that she typically drinks 1/5 of vodka daily, and last drink was yesterday.  She also states that she uses "140 bag of dope," referring to fentanyl," daily.  Last use was yesterday.  She states that over the past 12 hours, she has had worsening nausea and vomiting, bowel incontinence, diarrhea, tremors, and anxiety.  She denies any coingestions.  She denies any suicidal intent or ideation at this time, but on review of records has been noted to previously have suicidal thoughts.  She denies any current auditory or visual hallucinations, dizziness or lightheadedness, syncope, chest pain or shortness of breath, abdominal pain, hematuria dysuria, or any other complaints.  Past Medical History:  Diagnosis Date   Anxiety and depression 11/08/2011   Dysmenorrhea    Opioid dependence on agonist therapy (HCC)    Palpitations 01/22/2012   Polysubstance abuse (HCC)    Preventative health care 11/11/2011   Tobacco abuse 11/11/2011    Patient Active Problem List   Diagnosis Date Noted   Alcohol withdrawal (HCC) 01/13/2021   Back pain 12/24/2011   Preventative health care 11/11/2011   Tobacco abuse 11/11/2011   Anxiety and depression 11/08/2011   Dysmenorrhea     No past surgical history on file.   OB History   No obstetric history on file.     Family History  Problem Relation Age of Onset   Hyperlipidemia Mother    Hyperlipidemia Father    Diabetes Maternal Grandmother    Heart disease Maternal Grandfather        triple bypass   Hyperlipidemia Maternal  Grandfather    Hypertension Maternal Grandfather    Heart attack Maternal Grandfather     Social History   Tobacco Use   Smoking status: Every Day    Packs/day: 1.00    Years: 2.00    Pack years: 2.00    Types: Cigarettes   Smokeless tobacco: Never  Substance Use Topics   Alcohol use: Yes    Comment: occasionally   Drug use: No    Home Medications Prior to Admission medications   Not on File    Allergies    Ibuprofen  Review of Systems   Review of Systems  Constitutional:  Positive for diaphoresis. Negative for activity change, appetite change, chills and fever.  HENT:  Negative for ear pain and sore throat.   Eyes:  Negative for pain and visual disturbance.  Respiratory:  Negative for cough and shortness of breath.   Cardiovascular:  Negative for chest pain and palpitations.  Gastrointestinal:  Positive for diarrhea, nausea and vomiting. Negative for abdominal distention, abdominal pain, blood in stool and constipation.  Genitourinary:  Negative for dysuria and hematuria.  Musculoskeletal:  Negative for arthralgias and back pain.  Skin:  Negative for color change, pallor and rash.  Neurological:  Positive for tremors. Negative for dizziness, seizures, syncope, light-headedness, numbness and headaches.  Hematological:  Does not bruise/bleed easily.  Psychiatric/Behavioral:  Negative for confusion and hallucinations. The patient is nervous/anxious.   All other systems reviewed and are negative.  Physical  Exam Updated Vital Signs BP (!) 127/99   Pulse 99   Temp 99.5 F (37.5 C) (Oral)   Resp 14   Ht 5\' 4"  (1.626 m)   Wt 68 kg   SpO2 99%   BMI 25.75 kg/m   Physical Exam Vitals and nursing note reviewed.  Constitutional:      General: She is in acute distress.     Appearance: Normal appearance. She is well-developed and normal weight. She is diaphoretic. She is not ill-appearing or toxic-appearing.  HENT:     Head: Normocephalic and atraumatic.     Right  Ear: External ear normal.     Left Ear: External ear normal.     Nose: Nose normal.     Mouth/Throat:     Mouth: Mucous membranes are moist.     Pharynx: Oropharynx is clear.  Eyes:     General: No scleral icterus.    Extraocular Movements: Extraocular movements intact.     Conjunctiva/sclera: Conjunctivae normal.     Pupils: Pupils are equal, round, and reactive to light.  Neck:     Trachea: Trachea and phonation normal.  Cardiovascular:     Rate and Rhythm: Regular rhythm. Tachycardia present.     Heart sounds: No murmur heard. Pulmonary:     Effort: Pulmonary effort is normal. No respiratory distress.     Breath sounds: Normal breath sounds.  Abdominal:     General: There is no distension.     Palpations: Abdomen is soft.     Tenderness: There is no abdominal tenderness.  Musculoskeletal:        General: Normal range of motion.     Cervical back: Full passive range of motion without pain, normal range of motion and neck supple. No rigidity or tenderness. No spinous process tenderness or muscular tenderness.     Right lower leg: No edema.     Left lower leg: No edema.  Lymphadenopathy:     Cervical: No cervical adenopathy.  Skin:    General: Skin is warm.     Capillary Refill: Capillary refill takes less than 2 seconds.     Comments: Diffuse bruising on upper extremities, lower extremities, and buttocks.  Neurological:     General: No focal deficit present.     Mental Status: She is alert and oriented to person, place, and time. Mental status is at baseline.  Psychiatric:        Attention and Perception: Attention and perception normal. She does not perceive auditory or visual hallucinations.        Mood and Affect: Mood is anxious. Affect is tearful.        Speech: Speech normal.        Behavior: Behavior is agitated. Behavior is not aggressive. Behavior is cooperative.        Cognition and Memory: Cognition and memory normal.    ED Results / Procedures / Treatments    Labs (all labs ordered are listed, but only abnormal results are displayed) Labs Reviewed  COMPREHENSIVE METABOLIC PANEL - Abnormal; Notable for the following components:      Result Value   Potassium 3.1 (*)    Chloride 95 (*)    Glucose, Bld 168 (*)    AST 240 (*)    ALT 95 (*)    Total Bilirubin 2.3 (*)    Anion gap 19 (*)    All other components within normal limits  CBC - Abnormal; Notable for the following components:   RBC  3.58 (*)    MCV 107.5 (*)    MCH 36.9 (*)    Platelets 126 (*)    All other components within normal limits  AMMONIA - Abnormal; Notable for the following components:   Ammonia 42 (*)    All other components within normal limits  MAGNESIUM - Abnormal; Notable for the following components:   Magnesium 1.4 (*)    All other components within normal limits  ACETAMINOPHEN LEVEL - Abnormal; Notable for the following components:   Acetaminophen (Tylenol), Serum <10 (*)    All other components within normal limits  SALICYLATE LEVEL - Abnormal; Notable for the following components:   Salicylate Lvl <7.0 (*)    All other components within normal limits  CBG MONITORING, ED - Abnormal; Notable for the following components:   Glucose-Capillary 193 (*)    All other components within normal limits  I-STAT VENOUS BLOOD GAS, ED - Abnormal; Notable for the following components:   pH, Ven 7.567 (*)    pCO2, Ven 33.0 (*)    pO2, Ven 81.0 (*)    Bicarbonate 30.0 (*)    Acid-Base Excess 8.0 (*)    Potassium 3.1 (*)    Calcium, Ion 1.01 (*)    HCT 33.0 (*)    Hemoglobin 11.2 (*)    All other components within normal limits  RESP PANEL BY RT-PCR (FLU A&B, COVID) ARPGX2  ETHANOL  TSH  LACTIC ACID, PLASMA  RAPID URINE DRUG SCREEN, HOSP PERFORMED  BLOOD GAS, VENOUS  LACTIC ACID, PLASMA  I-STAT BETA HCG BLOOD, ED (MC, [facility], AP ONLY)  POC URINE PREG, ED  TROPONIN I (HIGH SENSITIVITY)  TROPONIN I (HIGH SENSITIVITY)    EKG None  Radiology DG Chest 1  View  Result Date: 01/13/2021 CLINICAL DATA:  Withdrawal, alcohol and fentanyl , chest pain, pt refused AP due to spazzing and trying to fall out of chair EXAM: CHEST  1 VIEW COMPARISON:  None. FINDINGS: Unremarkable lateral chest x-ray. IMPRESSION: Unremarkable lateral chest x-ray. Parental frontal AP xray was not obtained. Electronically Signed   By: Tish Frederickson M.D.   On: 01/13/2021 17:51    Procedures Procedures   Medications Ordered in ED Medications  LORazepam (ATIVAN) tablet 1-4 mg ( Oral See Alternative 01/14/21 0041)    Or  LORazepam (ATIVAN) injection 1-4 mg (4 mg Intravenous Given 01/14/21 0041)  thiamine tablet 100 mg ( Oral See Alternative 01/13/21 1748)    Or  thiamine (B-1) injection 100 mg (100 mg Intravenous Given 01/13/21 1748)  folic acid (FOLVITE) tablet 1 mg (1 mg Oral Given 01/13/21 1749)  multivitamin with minerals tablet 1 tablet (1 tablet Oral Given 01/13/21 1750)  lactated ringers bolus 1,000 mL (0 mLs Intravenous Stopped 01/13/21 1913)  magnesium sulfate IVPB 2 g 50 mL (0 g Intravenous Stopped 01/13/21 2039)  potassium chloride 10 mEq in 100 mL IVPB (0 mEq Intravenous Stopped 01/13/21 2057)    ED Course  I have reviewed the triage vital signs and the nursing notes.  Pertinent labs & imaging results that were available during my care of the patient were reviewed by me and considered in my medical decision making (see chart for details).    MDM Rules/Calculators/A&P                           Heather Shepherd is a 30 y.o. female presenting with ETOH and substance withdrawal. Initial VS sig for tachycardia, tachypnea, and HTN.  Protecting airway and has no immediate life threats.  BGL WNL.  EKG interpretation: Sinus tachycardia, prolonged QTC, rate 135 bpm.  No ST elevations or depressions, Q waves in inferior leads and T wave inversions in V3 through V6 with baseline artifact.  Labs: Coingestion labs unremarkable.  VBG with respiratory alkalosis,  consistent with tachypnea.  Lactic acid 1.5.  Troponin of 5.  TSH WNL.  COVID/flu negative.  EtOH level undetectable.  Beta hCG undetectable.  Ammonia 42.  Hypomagnesemic, hypokalemic, hypochloremic, with transaminitis and elevated T bili.  Anion gap of 19.  CBC with macrocytosis and mild thrombocytopenia.  Imaging: 1 view CXR without cardiopulmonary abnormality. Imaging was reviewed by radiology and personally by me.  DDX considered: Toxic coingestions, toxic alcohol consumption, substance intoxication, suicidal attempt, sepsis, ACS, thyroid dysfunction, trauma. History, examination, and objective data most consistent with EtOH withdrawal.  No signs of acute opioid intoxication, pupils are not constricted and patient has no signs of respiratory depression.  No smell of alcohol on exam and EtOH undetectable, consistent with withdrawal given history of heavy alcohol use.  Labs consistent with chronic alcohol use.  Patient has diffuse bruising as above, no sign of acute trauma and patient states the bruises are "from work."  No bony tenderness to palpation or decreased range of motion, ambulates without pain.  No systemic infectious signs or symptoms.  Medications: Medications  LORazepam (ATIVAN) tablet 1-4 mg ( Oral See Alternative 01/14/21 0041)    Or  LORazepam (ATIVAN) injection 1-4 mg (4 mg Intravenous Given 01/14/21 0041)  thiamine tablet 100 mg ( Oral See Alternative 01/13/21 1748)    Or  thiamine (B-1) injection 100 mg (100 mg Intravenous Given 01/13/21 1748)  folic acid (FOLVITE) tablet 1 mg (1 mg Oral Given 01/13/21 1749)  multivitamin with minerals tablet 1 tablet (1 tablet Oral Given 01/13/21 1750)  lactated ringers bolus 1,000 mL (0 mLs Intravenous Stopped 01/13/21 1913)  magnesium sulfate IVPB 2 g 50 mL (0 g Intravenous Stopped 01/13/21 2039)  potassium chloride 10 mEq in 100 mL IVPB (0 mEq Intravenous Stopped 01/13/21 2057)     CIWA protocol ordered on arrival.  Re-evaluated prior  to admission.  Hemodynamically stable with improvement to initial vitals, tachycardia improving.  Patient remains intermittently confused and agitated, getting up out of bed and ambulating in the hallway without clothing, or trying to unplug certain devices.  Safety sitter ordered.  Admitted to hospitalist in stable condition. CIWA protocol remains in place.  Patient understands and agrees with the plan.  Patient's mother updated at the bedside.  The plan for this patient was discussed with my attending physician, who voiced agreement and who oversaw evaluation and treatment of this patient.     Note: Chief Executive Officer was used in the creation of this note.  Final Clinical Impression(s) / ED Diagnoses Final diagnoses:  Polysubstance abuse (HCC)  Alcohol withdrawal syndrome, with delirium [facility])    Rx / DC Orders ED Discharge Orders     None        Dwaine Gale, DO 01/14/21 0051    Cheryll Cockayne, MD 01/18/21 [phone]

## 2021-01-13 NOTE — ED Notes (Addendum)
Pt brought from xray to h/w, moved directly to 17, EDP in to see, endorses ETOH withdrawal, h/o DTs. Pt alert, anxious, agitated, tremulous, shaky. Soiled with diarrhea. Family at [facility]. Orders received and initiated.

## 2021-01-14 ENCOUNTER — Encounter (HOSPITAL_COMMUNITY): Payer: Self-pay | Admitting: Internal Medicine

## 2021-01-14 DIAGNOSIS — F1990 Other psychoactive substance use, unspecified, uncomplicated: Secondary | ICD-10-CM | POA: Diagnosis present

## 2021-01-14 DIAGNOSIS — F10931 Alcohol use, unspecified with withdrawal delirium: Secondary | ICD-10-CM

## 2021-01-14 DIAGNOSIS — F1910 Other psychoactive substance abuse, uncomplicated: Secondary | ICD-10-CM | POA: Diagnosis present

## 2021-01-14 LAB — CBC
HCT: 33 % — ABNORMAL LOW (ref 36.0–46.0)
Hemoglobin: 11.7 g/dL — ABNORMAL LOW (ref 12.0–15.0)
MCH: 37.5 pg — ABNORMAL HIGH (ref 26.0–34.0)
MCHC: 35.5 g/dL (ref 30.0–36.0)
MCV: 105.8 fL — ABNORMAL HIGH (ref 80.0–100.0)
Platelets: 111 10*3/uL — ABNORMAL LOW (ref 150–400)
RBC: 3.12 MIL/uL — ABNORMAL LOW (ref 3.87–5.11)
RDW: 12.1 % (ref 11.5–15.5)
WBC: 3.6 10*3/uL — ABNORMAL LOW (ref 4.0–10.5)
nRBC: 0 % (ref 0.0–0.2)

## 2021-01-14 LAB — BASIC METABOLIC PANEL
Anion gap: 12 (ref 5–15)
BUN: 5 mg/dL — ABNORMAL LOW (ref 6–20)
CO2: 25 mmol/L (ref 22–32)
Calcium: 8.8 mg/dL — ABNORMAL LOW (ref 8.9–10.3)
Chloride: 101 mmol/L (ref 98–111)
Creatinine, Ser: 0.58 mg/dL (ref 0.44–1.00)
GFR, Estimated: >60 mL/min (ref >60)
Glucose, Bld: 128 mg/dL — ABNORMAL HIGH (ref 70–99)
Potassium: 3.4 mmol/L — ABNORMAL LOW (ref 3.5–5.1)
Sodium: 138 mmol/L (ref 135–145)

## 2021-01-14 LAB — GLUCOSE, CAPILLARY
Glucose-Capillary: 108 mg/dL — ABNORMAL HIGH (ref 70–99)
Glucose-Capillary: 117 mg/dL — ABNORMAL HIGH (ref 70–99)
Glucose-Capillary: 119 mg/dL — ABNORMAL HIGH (ref 70–99)
Glucose-Capillary: 119 mg/dL — ABNORMAL HIGH (ref 70–99)
Glucose-Capillary: 131 mg/dL — ABNORMAL HIGH (ref 70–99)
Glucose-Capillary: 153 mg/dL — ABNORMAL HIGH (ref 70–99)

## 2021-01-14 LAB — CBC WITH DIFFERENTIAL/PLATELET
Abs Immature Granulocytes: 0.02 10*3/uL (ref 0.00–0.07)
Basophils Absolute: 0 10*3/uL (ref 0.0–0.1)
Basophils Relative: 1 %
Eosinophils Absolute: 0 10*3/uL (ref 0.0–0.5)
Eosinophils Relative: 0 %
HCT: 32.2 % — ABNORMAL LOW (ref 36.0–46.0)
Hemoglobin: 11.1 g/dL — ABNORMAL LOW (ref 12.0–15.0)
Immature Granulocytes: 1 %
Lymphocytes Relative: 22 %
Lymphs Abs: 0.7 10*3/uL (ref 0.7–4.0)
MCH: 37 pg — ABNORMAL HIGH (ref 26.0–34.0)
MCHC: 34.5 g/dL (ref 30.0–36.0)
MCV: 107.3 fL — ABNORMAL HIGH (ref 80.0–100.0)
Monocytes Absolute: 0.3 10*3/uL (ref 0.1–1.0)
Monocytes Relative: 11 %
Neutro Abs: 2 10*3/uL (ref 1.7–7.7)
Neutrophils Relative %: 65 %
Platelets: 108 10*3/uL — ABNORMAL LOW (ref 150–400)
RBC: 3 MIL/uL — ABNORMAL LOW (ref 3.87–5.11)
RDW: 12.2 % (ref 11.5–15.5)
WBC: 3.1 10*3/uL — ABNORMAL LOW (ref 4.0–10.5)
nRBC: 0 % (ref 0.0–0.2)

## 2021-01-14 LAB — HEPATIC FUNCTION PANEL
ALT: 108 U/L — ABNORMAL HIGH (ref 0–44)
AST: 358 U/L — ABNORMAL HIGH (ref 15–41)
Albumin: 3.8 g/dL (ref 3.5–5.0)
Alkaline Phosphatase: 80 U/L (ref 38–126)
Bilirubin, Direct: 0.6 mg/dL — ABNORMAL HIGH (ref 0.0–0.2)
Indirect Bilirubin: 1.6 mg/dL — ABNORMAL HIGH (ref 0.3–0.9)
Total Bilirubin: 2.2 mg/dL — ABNORMAL HIGH (ref 0.3–1.2)
Total Protein: 6.7 g/dL (ref 6.5–8.1)

## 2021-01-14 LAB — LACTIC ACID, PLASMA: Lactic Acid, Venous: 1.3 mmol/L (ref 0.5–1.9)

## 2021-01-14 LAB — HIV ANTIBODY (ROUTINE TESTING W REFLEX): HIV Screen 4th Generation wRfx: NONREACTIVE

## 2021-01-14 LAB — CREATININE, SERUM
Creatinine, Ser: 0.63 mg/dL (ref 0.44–1.00)
GFR, Estimated: >60 mL/min (ref >60)

## 2021-01-14 LAB — MAGNESIUM: Magnesium: 2.1 mg/dL (ref 1.7–2.4)

## 2021-01-14 LAB — MRSA NEXT GEN BY PCR, NASAL: MRSA by PCR Next Gen: NOT DETECTED

## 2021-01-14 LAB — PHOSPHORUS: Phosphorus: 2.2 mg/dL — ABNORMAL LOW (ref 2.5–4.6)

## 2021-01-14 MED ORDER — THIAMINE HCL 100 MG/ML IJ SOLN: 100.0000 mg | Freq: Every day | INTRAMUSCULAR | Status: DC

## 2021-01-14 MED ORDER — PHENOBARBITAL SODIUM 130 MG/ML IJ SOLN
130.0000 mg | INTRAMUSCULAR | Status: DC | PRN
  Administered 2021-01-14: 130 mg via INTRAVENOUS
  Filled 2021-01-14 (×2): qty 1

## 2021-01-14 MED ORDER — SODIUM CHLORIDE 0.9 % IV SOLN
1.0000 mg | Freq: Every day | INTRAVENOUS | Status: DC
  Administered 2021-01-14: 1 mg via INTRAVENOUS
  Filled 2021-01-14 (×3): qty 0.2

## 2021-01-14 MED ORDER — PANTOPRAZOLE SODIUM 40 MG IV SOLR
40.0000 mg | Freq: Every day | INTRAVENOUS | Status: DC
  Administered 2021-01-14: 40 mg via INTRAVENOUS
  Filled 2021-01-14: qty 40

## 2021-01-14 MED ORDER — LORAZEPAM 2 MG/ML IJ SOLN
0.0000 mg | Freq: Three times a day (TID) | INTRAMUSCULAR | Status: DC
Start: 2021-01-16 — End: 2021-01-14

## 2021-01-14 MED ORDER — CHLORHEXIDINE GLUCONATE CLOTH 2 % EX PADS
6.0000 | MEDICATED_PAD | Freq: Every day | CUTANEOUS | Status: DC
  Administered 2021-01-14 – 2021-01-15 (×2): 6 via TOPICAL

## 2021-01-14 MED ORDER — DOCUSATE SODIUM 100 MG PO CAPS: 100.0000 mg | ORAL_CAPSULE | Freq: Two times a day (BID) | ORAL | Status: DC | PRN

## 2021-01-14 MED ORDER — FENTANYL CITRATE (PF) 100 MCG/2ML IJ SOLN
50.0000 ug | INTRAMUSCULAR | Status: DC | PRN
Start: 2021-01-14 — End: 2021-01-16

## 2021-01-14 MED ORDER — PHENOBARBITAL SODIUM 130 MG/ML IJ SOLN: 130.0000 mg | INTRAMUSCULAR | Status: DC | PRN

## 2021-01-14 MED ORDER — POTASSIUM PHOSPHATES 15 MMOLE/5ML IV SOLN
15.0000 mmol | Freq: Once | INTRAVENOUS | Status: AC
  Administered 2021-01-14: 15 mmol via INTRAVENOUS
  Filled 2021-01-14: qty 5

## 2021-01-14 MED ORDER — THIAMINE HCL 100 MG/ML IJ SOLN
500.0000 mg | Freq: Three times a day (TID) | INTRAVENOUS | Status: DC
  Administered 2021-01-14 – 2021-01-15 (×3): 500 mg via INTRAVENOUS
  Filled 2021-01-14 (×6): qty 5

## 2021-01-14 MED ORDER — KCL IN DEXTROSE-NACL 10-5-0.45 MEQ/L-%-% IV SOLN
INTRAVENOUS | Status: DC
  Filled 2021-01-14 (×2): qty 1000

## 2021-01-14 MED ORDER — HEPARIN SODIUM (PORCINE) 5000 UNIT/ML IJ SOLN
5000.0000 [IU] | Freq: Three times a day (TID) | INTRAMUSCULAR | Status: DC
  Administered 2021-01-14 – 2021-01-17 (×10): 5000 [IU] via SUBCUTANEOUS
  Filled 2021-01-14 (×11): qty 1

## 2021-01-14 MED ORDER — THIAMINE HCL 100 MG PO TABS: 100.0000 mg | ORAL_TABLET | Freq: Every day | ORAL | Status: DC

## 2021-01-14 MED ORDER — SODIUM CHLORIDE 0.9 % IV SOLN
650.0000 mg | Freq: Once | INTRAVENOUS | Status: AC
  Administered 2021-01-14: 650 mg via INTRAVENOUS
  Filled 2021-01-14: qty 5

## 2021-01-14 MED ORDER — POTASSIUM CHLORIDE 10 MEQ/100ML IV SOLN
10.0000 meq | INTRAVENOUS | Status: AC
  Administered 2021-01-14 (×2): 10 meq via INTRAVENOUS
  Filled 2021-01-14 (×2): qty 100

## 2021-01-14 MED ORDER — DEXMEDETOMIDINE HCL IN NACL 400 MCG/100ML IV SOLN
0.2000 ug/kg/h | INTRAVENOUS | Status: DC
  Administered 2021-01-14: 0.2 ug/kg/h via INTRAVENOUS
  Administered 2021-01-14: 0.4 ug/kg/h via INTRAVENOUS
  Administered 2021-01-15 (×3): 0.7 ug/kg/h via INTRAVENOUS
  Administered 2021-01-16: 0.6 ug/kg/h via INTRAVENOUS
  Administered 2021-01-16: 1 ug/kg/h via INTRAVENOUS
  Administered 2021-01-17: 0.6 ug/kg/h via INTRAVENOUS
  Administered 2021-01-17: 0.8 ug/kg/h via INTRAVENOUS
  Filled 2021-01-14 (×8): qty 100

## 2021-01-14 MED ORDER — LORAZEPAM 2 MG/ML IJ SOLN
1.0000 mg | INTRAMUSCULAR | Status: DC | PRN
  Administered 2021-01-14: 1 mg via INTRAVENOUS
  Administered 2021-01-14 – 2021-01-16 (×8): 2 mg via INTRAVENOUS
  Administered 2021-01-17: 4 mg via INTRAVENOUS
  Administered 2021-01-17 (×2): 2 mg via INTRAVENOUS
  Filled 2021-01-14 (×11): qty 1
  Filled 2021-01-14: qty 2
  Filled 2021-01-14: qty 1

## 2021-01-14 MED ORDER — LORAZEPAM 2 MG/ML IJ SOLN
0.0000 mg | INTRAMUSCULAR | Status: DC
Start: 2021-01-14 — End: 2021-01-14
  Administered 2021-01-14: 4 mg via INTRAVENOUS

## 2021-01-14 MED ORDER — LORAZEPAM 1 MG PO TABS: 1.0000 mg | ORAL_TABLET | ORAL | Status: DC | PRN

## 2021-01-14 MED ORDER — ADULT MULTIVITAMIN W/MINERALS CH
1.0000 | ORAL_TABLET | Freq: Every day | ORAL | Status: DC
  Administered 2021-01-14 – 2021-01-17 (×4): 1 via ORAL
  Filled 2021-01-14 (×4): qty 1

## 2021-01-14 MED ORDER — POLYETHYLENE GLYCOL 3350 17 G PO PACK: 17.0000 g | PACK | Freq: Every day | ORAL | Status: DC | PRN

## 2021-01-14 MED ORDER — NICOTINE 14 MG/24HR TD PT24
14.0000 mg | MEDICATED_PATCH | Freq: Every day | TRANSDERMAL | Status: DC
  Administered 2021-01-14 – 2021-01-16 (×3): 14 mg via TRANSDERMAL
  Filled 2021-01-14 (×3): qty 1

## 2021-01-14 MED ORDER — FOLIC ACID 1 MG PO TABS: 1.0000 mg | ORAL_TABLET | Freq: Every day | ORAL | Status: DC

## 2021-01-14 MED ORDER — SODIUM CHLORIDE 0.9 % IV SOLN: 260.0000 mg | INTRAVENOUS | Status: DC | PRN

## 2021-01-14 MED ORDER — LORAZEPAM 2 MG/ML IJ SOLN
1.0000 mg | INTRAMUSCULAR | Status: DC | PRN
  Filled 2021-01-14: qty 2

## 2021-01-14 MED ORDER — DEXMEDETOMIDINE HCL IN NACL 400 MCG/100ML IV SOLN
0.4000 ug/kg/h | INTRAVENOUS | Status: DC
  Filled 2021-01-14: qty 100

## 2021-01-14 MED ORDER — FOLIC ACID 1 MG PO TABS
1.0000 mg | ORAL_TABLET | Freq: Every day | ORAL | Status: DC
  Administered 2021-01-15 – 2021-01-17 (×3): 1 mg via ORAL
  Filled 2021-01-14 (×4): qty 1

## 2021-01-14 NOTE — ED Notes (Signed)
Purwick placed. Toileting offered. Oral hydration offered but declined.

## 2021-01-14 NOTE — ED Notes (Signed)
Pt found hallways naked again. Directed to room. Agitated. Confused. CIWA 27. MD notified.
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
## 2021-01-14 NOTE — Progress Notes (Signed)
Pt requiring max dose of precedex to address CIWA of 15. Originally planned to [employer] 260 mg and look to titrate precedex down. Reassessment after 1st 130 mg of phenobarb allowed for d/c precedex and pt still very lethargic with good resp effort and VS. Additional phenobarb returned to pyxis and continue to monitor closely.

## 2021-01-14 NOTE — ED Notes (Signed)
Pt witnessed "fall" by RN passing by. Pt sts a kitty was having babies on her. Pt increasingly confused. Per RN pt dropped to knees and was assisted by RN. Denies injury. Mental status same as before the fall. Pt has noted bruising all over body previous to fall. MD notified. Order for restraints obtained for pt safety.

## 2021-01-14 NOTE — Progress Notes (Signed)
eLink Physician-Brief Progress Note Patient Name: Heather Shepherd DOB: 09/07/1990 MRN: [id number]   Date of Service  01/14/2021  HPI/Events of Note  Patient admitted via the ED with ETOH and Fentanyl abuse history, going through withdrawal and requesting detox. She is being admitted to the ICU due to requirement for Fentanyl gtt.  eICU Interventions  New Patient Evaluation.        Thomasene Lot Daemyn Gariepy 01/14/2021, 5:41 AM

## 2021-01-14 NOTE — H&P (Signed)
History and Physical    Heather Shepherd TUU:[id number] DOB: 08-Dec-1990 DOA: 01/13/2021  PCP: Bradd Canary, MD  Patient coming from: Home.  Chief Complaint: Alcohol detox.  HPI: Heather Shepherd is a 30 y.o. female with history of polysubstance abuse including alcohol and fentanyl presents to the ER for detox.  Patient states she drinks vodka every day and also has been abusing fentanyl.  Patient denies any IV injection but on exam does have multiple ecchymotic areas on the extremities.  ED Course: In the ER patient was agitated and tremulous.  Was started on CIWA protocol.  Labs show elevated AST of 240 ALT of 95 which has been elevated even previously on recent labs seen in Care Everywhere.  Likely from alcoholism.  Blood glucose 168 potassium 3.1 total bilirubin 2.3 denies any abdominal pain chest pain or shortness of breath.  CBC shows macrocytosis.  EKG shows sinus tach.  Chest x-ray unremarkable COVID test negative.  Review of Systems: As per HPI, rest all negative.   Past Medical History:  Diagnosis Date   Anxiety and depression 11/08/2011   Dysmenorrhea    Opioid dependence on agonist therapy (HCC)    Palpitations 01/22/2012   Polysubstance abuse (HCC)    Preventative health care 11/11/2011   Tobacco abuse 11/11/2011    History reviewed. No pertinent surgical history.   reports that she has been smoking. She has a 2.00 pack-year smoking history. She has never used smokeless tobacco. She reports current alcohol use. She reports that she does not use drugs.  Allergies  Allergen Reactions   Ibuprofen Hives    Family History  Problem Relation Age of Onset   Hyperlipidemia Mother    Hyperlipidemia Father    Diabetes Maternal Grandmother    Heart disease Maternal Grandfather        triple bypass   Hyperlipidemia Maternal Grandfather    Hypertension Maternal Grandfather    Heart attack Maternal Grandfather     Prior to Admission medications   Not on File     Physical Exam: Constitutional: Moderately built and nourished. Vitals:   01/13/21 2130 01/13/21 2205 01/13/21 2315 01/14/21 0100  BP: (!) 122/91 114/85 (!) 127/99 (!) 123/96  Pulse: (!) 106 (!) 102 99 85  Resp: (!) 23 14 14 18   Temp:    99.3 F (37.4 C)  TempSrc:    Oral  SpO2: 98% 99% 99% 100%  Weight:      Height:       Eyes: Anicteric no pallor. ENMT: No discharge from the ears eyes nose and mouth. Neck: No mass felt.  No neck rigidity. Respiratory: No rhonchi or crepitations. Cardiovascular: S1-S2 heard. Abdomen: Soft nontender bowel sound present. Musculoskeletal: No edema.   Skin: Multiple ecchymotic areas. Neurologic: Alert awake oriented to time place and person.  Moves all extremities. Psychiatric: Appears agitated.  Tremulous.  Denies suicidal ideation.   Labs on Admission: I have personally reviewed following labs and imaging studies  CBC: Recent Labs  Lab 01/13/21 1659 01/13/21 1945  WBC 4.4  --   HGB 13.2 11.2*  HCT 38.5 33.0*  MCV 107.5*  --   PLT 126*  --    Basic Metabolic Panel: Recent Labs  Lab 01/13/21 1659 01/13/21 1735 01/13/21 1945  NA 137  --  136  K 3.1*  --  3.1*  CL 95*  --   --   CO2 23  --   --   GLUCOSE 168*  --   --  BUN 7  --   --   CREATININE 0.75  --   --   CALCIUM 9.0  --   --   MG  --  1.4*  --    GFR: Estimated Creatinine Clearance: 99.2 mL/min (by C-G formula based on SCr of 0.75 mg/dL). Liver Function Tests: Recent Labs  Lab 01/13/21 1659  AST 240*  ALT 95*  ALKPHOS 90  BILITOT 2.3*  PROT 7.8  ALBUMIN 4.4   No results for input(s): LIPASE, AMYLASE in the last 168 hours. Recent Labs  Lab 01/13/21 1735  AMMONIA 42*   Coagulation Profile: No results for input(s): INR, PROTIME in the last 168 hours. Cardiac Enzymes: No results for input(s): CKTOTAL, CKMB, CKMBINDEX, TROPONINI in the last 168 hours. BNP (last 3 results) No results for input(s): PROBNP in the last 8760 hours. HbA1C: No results for  input(s): HGBA1C in the last 72 hours. CBG: Recent Labs  Lab 01/13/21 1917  GLUCAP 193*   Lipid Profile: No results for input(s): CHOL, HDL, LDLCALC, TRIG, CHOLHDL, LDLDIRECT in the last 72 hours. Thyroid Function Tests: Recent Labs    01/13/21 1735  TSH 1.085   Anemia Panel: No results for input(s): VITAMINB12, FOLATE, FERRITIN, TIBC, IRON, RETICCTPCT in the last 72 hours. Urine analysis:    Component Value Date/Time   COLORURINE RED (A) 02/21/2011 1756   APPEARANCEUR TURBID (A) 02/21/2011 1756   LABSPEC 1.021 02/21/2011 1756   PHURINE 6.5 02/21/2011 1756   GLUCOSEU NEGATIVE 02/21/2011 1756   HGBUR LARGE (A) 02/21/2011 1756   BILIRUBINUR NEGATIVE 02/21/2011 1756   KETONESUR NEGATIVE 02/21/2011 1756   PROTEINUR 100 (A) 02/21/2011 1756   UROBILINOGEN 1.0 02/21/2011 1756   NITRITE NEGATIVE 02/21/2011 1756   LEUKOCYTESUR MODERATE (A) 02/21/2011 1756   Sepsis Labs: @LABRCNTIP (procalcitonin:4,lacticidven:4) ) Recent Results (from the past 240 hour(s))  Resp Panel by RT-PCR (Flu A&B, Covid) Nasopharyngeal Swab     Status: None   Collection Time: 01/13/21  5:46 PM   Specimen: Nasopharyngeal Swab; Nasopharyngeal(NP) swabs in vial transport medium  Result Value Ref Range Status   SARS Coronavirus 2 by RT PCR NEGATIVE NEGATIVE Final    Comment: (NOTE) SARS-CoV-2 target nucleic acids are NOT DETECTED.  The SARS-CoV-2 RNA is generally detectable in upper respiratory specimens during the acute phase of infection. The lowest concentration of SARS-CoV-2 viral copies this assay can detect is 138 copies/mL. A negative result does not preclude SARS-Cov-2 infection and should not be used as the sole basis for treatment or other patient management decisions. A negative result may occur with  improper specimen collection/handling, submission of specimen other than nasopharyngeal swab, presence of viral mutation(s) within the areas targeted by this assay, and inadequate number of  viral copies(<138 copies/mL). A negative result must be combined with clinical observations, patient history, and epidemiological information. The expected result is Negative.  Fact Sheet for Patients:  01/15/21  Fact Sheet for Healthcare Providers:  BloggerCourse.com  This test is no t yet approved or cleared by the [place] FDA and  has been authorized for detection and/or diagnosis of SARS-CoV-2 by FDA under an Emergency Use Authorization (EUA). This EUA will remain  in effect (meaning this test can be used) for the duration of the COVID-19 declaration under Section 564(b)(1) of the Act, 21 U.S.C.section 360bbb-3(b)(1), unless the authorization is terminated  or revoked sooner.       Influenza A by PCR NEGATIVE NEGATIVE Final   Influenza B by PCR NEGATIVE NEGATIVE Final  Comment: (NOTE) The Xpert Xpress SARS-CoV-2/FLU/RSV plus assay is intended as an aid in the diagnosis of influenza from Nasopharyngeal swab specimens and should not be used as a sole basis for treatment. Nasal washings and aspirates are unacceptable for Xpert Xpress SARS-CoV-2/FLU/RSV testing.  Fact Sheet for Patients: BloggerCourse.com  Fact Sheet for Healthcare Providers: SeriousBroker.it  This test is not yet approved or cleared by the [place] FDA and has been authorized for detection and/or diagnosis of SARS-CoV-2 by FDA under an Emergency Use Authorization (EUA). This EUA will remain in effect (meaning this test can be used) for the duration of the COVID-19 declaration under Section 564(b)(1) of the Act, 21 U.S.C. section 360bbb-3(b)(1), unless the authorization is terminated or revoked.  Performed at [facility], [address] [place], [place] [address]      Radiological Exams on Admission: DG Chest 1 View  Result Date: 01/13/2021 CLINICAL DATA:  Withdrawal,  alcohol and fentanyl , chest pain, pt refused AP due to spazzing and trying to fall out of chair EXAM: CHEST  1 VIEW COMPARISON:  None. FINDINGS: Unremarkable lateral chest x-ray. IMPRESSION: Unremarkable lateral chest x-ray. Parental frontal AP xray was not obtained. Electronically Signed   By: Tish Frederickson M.D.   On: 01/13/2021 17:51    EKG: Independently reviewed.  Sinus tachycardia.  Assessment/Plan Principal Problem:   Alcohol withdrawal (HCC) Active Problems:   Polysubstance abuse (HCC)    Alcohol and drug withdrawal on CIWA protocol.  We will closely monitor in stepdown.  May need Precedex if patient's symptoms worsen.  Social work consult. Elevated LFTs likely from alcohol abuse and alcoholic hepatitis.  Has been elevated in the recent past in Care Everywhere.  Check acute hepatitis panel with next blood draw.  Follow LFTs. Macrocytosis likely from alcoholism.  Check anemia panel with next blood draw. Thrombocytopenia likely from alcoholism follow CBC.  Since patient is having active alcohol withdrawal undergo withdrawal will need close monitoring and inpatient status.   DVT prophylaxis: Heparin. Code Status: Full code. Family Communication: Discussed with patient. Disposition Plan: To be determined. Consults called: May need to have [facility] consult if patient's symptoms worsen. Admission status: Inpatient.   Eduard Clos MD [facility] Pager [phone].  If 7PM-7AM, please contact night-coverage [email or web address] Password [email or web address]  01/14/2021, 1:31 AM

## 2021-01-14 NOTE — Progress Notes (Signed)
On max dose of precedex.

## 2021-01-14 NOTE — ED Notes (Signed)
Pt removed purwick and talking to it like a telephone. Pt sees people doing cartwheels and screaming at her kids.

## 2021-01-14 NOTE — Progress Notes (Signed)
NAME:  Heather Shepherd, MRN:  [id number], DOB:  10-31-1990, LOS: 1 ADMISSION DATE:  01/13/2021, CONSULTATION DATE:  01/14/21 REFERRING MD:  Dr. Georgetta Haber, CHIEF COMPLAINT:  ETOH withdrawal   History of Present Illness:  30 yo female with hx of ETOH and polysubstance abuse presented to ER for detox.  Drinks a 5th of vodka daily and abuses fentanyl.  ETOH level < 10 on admission.  Developed agitation and tremor in ER.  Not improved with CIWA protocol  transferred to ICU and started on precedex.  Pertinent  Medical History  Substance abuse  Significant Hospital Events: Including procedures, antibiotic start and stop dates in addition to other pertinent events   10/29 admit, start precedex  Interim History / Subjective:  Received phenobarb overnight.  Objective   Blood pressure (!) 120/94, pulse 69, temperature 98.1 F (36.7 C), temperature source Oral, resp. rate 15, height 5\' 4"  (1.626 m), weight 65 kg, SpO2 99 %.        Intake/Output Summary (Last 24 hours) at 01/14/2021 01/16/2021 Last data filed at 01/14/2021 0730 Gross per 24 hour  Intake 112.34 ml  Output 200 ml  Net -87.66 ml   Filed Weights   01/13/21 1913 01/14/21 0500  Weight: 68 kg 65 kg    Examination:  General - sedated Eyes - pupils reactive ENT - no sinus tenderness, no stridor Cardiac - regular rate/rhythm, no murmur Chest - equal breath sounds b/l, no wheezing or rales Abdomen - soft, non tender, + bowel sounds Extremities - no cyanosis, clubbing, or edema Skin - no rashes Neuro - withdraws with stimulation, not following commands  Resolved Hospital Problem list     Assessment & Plan:   Delirium tremens in setting of ETOH and fentanyl withdrawal. - prn ativan for CIWA > 8 - predecex as needed for RASS goal 0 to -1 - thiamine, folic acid, MVI - d/c phenobarbital - f/u HIV test from 10/29  Elevated LFTs in setting of ETOH. - f/u CMET - check hepatitis panel  Leukopenia, macrocytic anemia,  thrombocytopenia. - likely all related to ETOH - f/u CBC  Hypokalemia, hypophosphatemia. - f/u electrolytes  Best Practice (right click and "Reselect all SmartList Selections" daily)   Diet/type: NPO w/ oral meds DVT prophylaxis: prophylactic heparin  GI prophylaxis: PPI Lines: N/A Foley:  N/A Code Status:  full code Last date of multidisciplinary goals of care discussion [x]   Labs    CMP Latest Ref Rng & Units 01/14/2021 01/14/2021 01/13/2021  Glucose 70 - 99 mg/dL 01/16/2021) - -  BUN 6 - 20 mg/dL 5(L) - -  Creatinine 01/15/2021 - 1.00 mg/dL 412(I 7.86 -  Sodium 7.67 - 145 mmol/L 138 - 136  Potassium 3.5 - 5.1 mmol/L 3.4(L) - 3.1(L)  Chloride 98 - 111 mmol/L 101 - -  CO2 22 - 32 mmol/L 25 - -  Calcium 8.9 - 10.3 mg/dL 2.09) - -  Total Protein 6.5 - 8.1 g/dL 6.7 - -  Total Bilirubin 0.3 - 1.2 mg/dL 2.2(H) - -  Alkaline Phos 38 - 126 U/L 80 - -  AST 15 - 41 U/L 358(H) - -  ALT 0 - 44 U/L 108(H) - -    CBC Latest Ref Rng & Units 01/14/2021 01/14/2021 01/13/2021  WBC 4.0 - 10.5 K/uL 3.1(L) 3.6(L) -  Hemoglobin 12.0 - 15.0 g/dL 11.1(L) 11.7(L) 11.2(L)  Hematocrit 36.0 - 46.0 % 32.2(L) 33.0(L) 33.0(L)  Platelets 150 - 400 K/uL 108(L) 111(L) -    Critical care time:  43 minutes  Coralyn Helling, MD [facility] Pulmonary/Critical Care Pager - [phone] 01/14/2021, 9:39 AM

## 2021-01-14 NOTE — Progress Notes (Signed)
[facility] ADULT ICU REPLACEMENT PROTOCOL   The patient does apply for the [facility] Adult ICU Electrolyte Replacment Protocol based on the criteria listed below:   1.Exclusion criteria: TCTS patients, ECMO patients, and Dialysis patients 2. Is GFR >/= 30 ml/min? Yes.    Patient's GFR today is >60 3. Is SCr </= 2? Yes.   Patient's SCr is 0.58 mg/dL 4. Did SCr increase >/= 0.5 in 24 hours? No. 5.Pt's weight >40kg  Yes.   6. Abnormal electrolyte(s):  K 3.4, Phos 2.2  7. Electrolytes replaced per protocol 8.  Call MD STAT for K+ </= 2.5, Phos </= 1, or Mag </= 1 Physician:  Shawn Stall R Farha Dano 01/14/2021 5:46 AM

## 2021-01-15 LAB — COMPREHENSIVE METABOLIC PANEL
ALT: 96 U/L — ABNORMAL HIGH (ref 0–44)
AST: 194 U/L — ABNORMAL HIGH (ref 15–41)
Albumin: 3.8 g/dL (ref 3.5–5.0)
Alkaline Phosphatase: 89 U/L (ref 38–126)
Anion gap: 13 (ref 5–15)
BUN: <5 mg/dL — ABNORMAL LOW (ref 6–20)
CO2: 19 mmol/L — ABNORMAL LOW (ref 22–32)
Calcium: 9 mg/dL (ref 8.9–10.3)
Chloride: 105 mmol/L (ref 98–111)
Creatinine, Ser: 0.72 mg/dL (ref 0.44–1.00)
GFR, Estimated: >60 mL/min (ref >60)
Glucose, Bld: 118 mg/dL — ABNORMAL HIGH (ref 70–99)
Potassium: 3.3 mmol/L — ABNORMAL LOW (ref 3.5–5.1)
Sodium: 137 mmol/L (ref 135–145)
Total Bilirubin: 1.9 mg/dL — ABNORMAL HIGH (ref 0.3–1.2)
Total Protein: 7 g/dL (ref 6.5–8.1)

## 2021-01-15 LAB — HEPATITIS PANEL, ACUTE
HCV Ab: NONREACTIVE
Hep A IgM: NONREACTIVE
Hep B C IgM: NONREACTIVE
Hepatitis B Surface Ag: NONREACTIVE

## 2021-01-15 LAB — GLUCOSE, CAPILLARY
Glucose-Capillary: 123 mg/dL — ABNORMAL HIGH (ref 70–99)
Glucose-Capillary: 130 mg/dL — ABNORMAL HIGH (ref 70–99)
Glucose-Capillary: 139 mg/dL — ABNORMAL HIGH (ref 70–99)
Glucose-Capillary: 134 mg/dL — ABNORMAL HIGH (ref 70–99)
Glucose-Capillary: 143 mg/dL — ABNORMAL HIGH (ref 70–99)
Glucose-Capillary: 142 mg/dL — ABNORMAL HIGH (ref 70–99)

## 2021-01-15 LAB — MAGNESIUM: Magnesium: 2 mg/dL (ref 1.7–2.4)

## 2021-01-15 LAB — PHOSPHORUS: Phosphorus: 2.3 mg/dL — ABNORMAL LOW (ref 2.5–4.6)

## 2021-01-15 MED ORDER — SODIUM CHLORIDE 0.9 % IV SOLN: INTRAVENOUS | Status: DC | PRN

## 2021-01-15 MED ORDER — POTASSIUM PHOSPHATES 15 MMOLE/5ML IV SOLN
15.0000 mmol | Freq: Once | INTRAVENOUS | Status: AC
  Administered 2021-01-15: 15 mmol via INTRAVENOUS
  Filled 2021-01-15: qty 5

## 2021-01-15 MED ORDER — ACETAMINOPHEN 325 MG PO TABS
650.0000 mg | ORAL_TABLET | Freq: Four times a day (QID) | ORAL | Status: DC | PRN
  Administered 2021-01-16 – 2021-01-17 (×2): 650 mg via ORAL
  Filled 2021-01-15 (×3): qty 2

## 2021-01-15 MED ORDER — PANTOPRAZOLE SODIUM 40 MG PO TBEC
40.0000 mg | DELAYED_RELEASE_TABLET | Freq: Every day | ORAL | Status: DC
  Administered 2021-01-15: 40 mg via ORAL
  Filled 2021-01-15: qty 1

## 2021-01-15 MED ORDER — LIP MEDEX EX OINT
TOPICAL_OINTMENT | CUTANEOUS | Status: DC | PRN
  Filled 2021-01-15: qty 7

## 2021-01-15 MED ORDER — THIAMINE HCL 100 MG PO TABS
500.0000 mg | ORAL_TABLET | Freq: Three times a day (TID) | ORAL | Status: AC
  Administered 2021-01-15 – 2021-01-17 (×5): 500 mg via ORAL
  Filled 2021-01-15 (×5): qty 5

## 2021-01-15 MED ORDER — CHLORDIAZEPOXIDE HCL 25 MG PO CAPS
50.0000 mg | ORAL_CAPSULE | Freq: Four times a day (QID) | ORAL | Status: DC
  Administered 2021-01-15 – 2021-01-17 (×10): 50 mg via ORAL
  Filled 2021-01-15 (×10): qty 2

## 2021-01-15 NOTE — Progress Notes (Signed)
NAME:  Heather Shepherd, MRN:  [id number], DOB:  1990/06/01, LOS: 2 ADMISSION DATE:  01/13/2021, CONSULTATION DATE:  01/14/21 REFERRING MD:  Dr. Georgetta Haber, CHIEF COMPLAINT:  ETOH withdrawal   History of Present Illness:  30 yo female with hx of ETOH and polysubstance abuse presented to ER for detox.  Drinks a 5th of vodka daily and abuses fentanyl.  ETOH level < 10 on admission.  Developed agitation and tremor in ER.  Not improved with CIWA protocol  transferred to ICU and started on precedex.  Pertinent  Medical History  Substance abuse  Significant Hospital Events: Including procedures, antibiotic start and stop dates in addition to other pertinent events   10/29 admit, start precedex  Interim History / Subjective:  She thought she was in [facility]  Didn't know how she got to the hospital.  Remembers snorting fentanyl  Objective   Blood pressure (!) 119/99, pulse 68, temperature 98.4 F (36.9 C), temperature source Axillary, resp. rate 19, height 5\' 4"  (1.626 m), weight 65 kg, SpO2 99 %.        Intake/Output Summary (Last 24 hours) at 01/15/2021 0744 Last data filed at 01/15/2021 0500 Gross per 24 hour  Intake 1213.22 ml  Output 700 ml  Net 513.22 ml   Filed Weights   01/13/21 1913 01/14/21 0500  Weight: 68 kg 65 kg    Examination:  General - alert Eyes - pupils reactive ENT - no sinus tenderness, no stridor Cardiac - regular rate/rhythm, no murmur Chest - equal breath sounds b/l, no wheezing or rales Abdomen - soft, non tender, + bowel sounds Extremities - no cyanosis, clubbing, or edema Skin - no rashes Neuro - oriented to person and time  Resolved Hospital Problem list     Assessment & Plan:   Delirium tremens in setting of ETOH and fentanyl withdrawal. - add librium 50 mg q6h and wean off as tolerated - prn ativan for CIWA > 8 - wean precedex to keep RASS 0 - thiamine, folic acid, MVI - HIV test negative  Elevated LFTs in setting of ETOH. -  improving - f/u hepatitis panel from 10/30  Leukopenia, macrocytic anemia, thrombocytopenia. - likely all related to ETOH - f/u CBC intermittently  Hypokalemia, hypophosphatemia. - replace electrolytes  Best Practice (right click and "Reselect all SmartList Selections" daily)   Diet/type: Regular consistency (see orders) DVT prophylaxis: prophylactic heparin  GI prophylaxis: PPI Lines: N/A Foley:  N/A Code Status:  full code Last date of multidisciplinary goals of care discussion [x]   Labs    CMP Latest Ref Rng & Units 01/15/2021 01/14/2021 01/14/2021  Glucose 70 - 99 mg/dL 01/16/2021) 01/16/2021) -  BUN 6 - 20 mg/dL 025(E) 5(L) -  Creatinine 0.44 - 1.00 mg/dL 527(P <8(E 4.23  Sodium 135 - 145 mmol/L 137 138 -  Potassium 3.5 - 5.1 mmol/L 3.3(L) 3.4(L) -  Chloride 98 - 111 mmol/L 105 101 -  CO2 22 - 32 mmol/L 19(L) 25 -  Calcium 8.9 - 10.3 mg/dL 9.0 5.36) -  Total Protein 6.5 - 8.1 g/dL 7.0 6.7 -  Total Bilirubin 0.3 - 1.2 mg/dL 1.44) 2.2(H) -  Alkaline Phos 38 - 126 U/L 89 80 -  AST 15 - 41 U/L 194(H) 358(H) -  ALT 0 - 44 U/L 96(H) 108(H) -    CBC Latest Ref Rng & Units 01/14/2021 01/14/2021 01/13/2021  WBC 4.0 - 10.5 K/uL 3.1(L) 3.6(L) -  Hemoglobin 12.0 - 15.0 g/dL 11.1(L) 11.7(L) 11.2(L)  Hematocrit 36.0 -  46.0 % 32.2(L) 33.0(L) 33.0(L)  Platelets 150 - 400 K/uL 108(L) 111(L) -    Critical care time: 32 minutes  Coralyn Helling, MD  Pulmonary/Critical Care Pager - [phone] 01/15/2021, 7:44 AM

## 2021-01-15 NOTE — Progress Notes (Signed)
Patient asking for sedative when she wakes up.

## 2021-01-15 NOTE — Progress Notes (Signed)
eLink Physician-Brief Progress Note Patient Name: Heather Shepherd DOB: Apr 15, 1990 MRN: [id number]   Date of Service  01/15/2021  HPI/Events of Note  ETOH withdrawal with agitated delirium.  eICU Interventions  Precedex ceiling increased to 1.2 mcg, restraints and sitter orders renewed.        Thomasene Lot Chenise Mulvihill 01/15/2021, 9:40 PM
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
## 2021-01-16 DIAGNOSIS — F1910 Other psychoactive substance abuse, uncomplicated: Secondary | ICD-10-CM

## 2021-01-16 LAB — CBC
HCT: 34.8 % — ABNORMAL LOW (ref 36.0–46.0)
Hemoglobin: 12.6 g/dL (ref 12.0–15.0)
MCH: 38.2 pg — ABNORMAL HIGH (ref 26.0–34.0)
MCHC: 36.2 g/dL — ABNORMAL HIGH (ref 30.0–36.0)
MCV: 105.5 fL — ABNORMAL HIGH (ref 80.0–100.0)
Platelets: 127 10*3/uL — ABNORMAL LOW (ref 150–400)
RBC: 3.3 MIL/uL — ABNORMAL LOW (ref 3.87–5.11)
RDW: 11.8 % (ref 11.5–15.5)
WBC: 4.7 10*3/uL (ref 4.0–10.5)
nRBC: 0 % (ref 0.0–0.2)

## 2021-01-16 LAB — BASIC METABOLIC PANEL
Anion gap: 10 (ref 5–15)
BUN: <5 mg/dL — ABNORMAL LOW (ref 6–20)
CO2: 20 mmol/L — ABNORMAL LOW (ref 22–32)
Calcium: 8.7 mg/dL — ABNORMAL LOW (ref 8.9–10.3)
Chloride: 104 mmol/L (ref 98–111)
Creatinine, Ser: 0.63 mg/dL (ref 0.44–1.00)
GFR, Estimated: >60 mL/min (ref >60)
Glucose, Bld: 141 mg/dL — ABNORMAL HIGH (ref 70–99)
Potassium: 2.9 mmol/L — ABNORMAL LOW (ref 3.5–5.1)
Sodium: 134 mmol/L — ABNORMAL LOW (ref 135–145)

## 2021-01-16 LAB — GLUCOSE, CAPILLARY
Glucose-Capillary: 124 mg/dL — ABNORMAL HIGH (ref 70–99)
Glucose-Capillary: 127 mg/dL — ABNORMAL HIGH (ref 70–99)
Glucose-Capillary: 144 mg/dL — ABNORMAL HIGH (ref 70–99)
Glucose-Capillary: 145 mg/dL — ABNORMAL HIGH (ref 70–99)
Glucose-Capillary: 177 mg/dL — ABNORMAL HIGH (ref 70–99)

## 2021-01-16 LAB — MAGNESIUM: Magnesium: 1.8 mg/dL (ref 1.7–2.4)

## 2021-01-16 LAB — PHOSPHORUS: Phosphorus: 3.1 mg/dL (ref 2.5–4.6)

## 2021-01-16 MED ORDER — POTASSIUM CHLORIDE 10 MEQ/100ML IV SOLN
10.0000 meq | INTRAVENOUS | Status: DC
  Administered 2021-01-16: 10 meq via INTRAVENOUS
  Filled 2021-01-16 (×2): qty 100

## 2021-01-16 MED ORDER — MAGNESIUM SULFATE 2 GM/50ML IV SOLN
2.0000 g | Freq: Once | INTRAVENOUS | Status: AC
  Administered 2021-01-16: 2 g via INTRAVENOUS
  Filled 2021-01-16: qty 50

## 2021-01-16 MED ORDER — ALUM & MAG HYDROXIDE-SIMETH 200-200-20 MG/5ML PO SUSP
30.0000 mL | Freq: Four times a day (QID) | ORAL | Status: DC | PRN
  Administered 2021-01-16: 30 mL via ORAL
  Filled 2021-01-16: qty 30

## 2021-01-16 MED ORDER — POTASSIUM CHLORIDE CRYS ER 20 MEQ PO TBCR
40.0000 meq | EXTENDED_RELEASE_TABLET | ORAL | Status: AC
  Administered 2021-01-16 (×2): 40 meq via ORAL
  Filled 2021-01-16 (×2): qty 2

## 2021-01-16 MED ORDER — METHADONE HCL 10 MG PO TABS: 10.0000 mg | ORAL_TABLET | Freq: Every day | ORAL | Status: DC

## 2021-01-16 MED ORDER — TRAMADOL HCL 50 MG PO TABS
50.0000 mg | ORAL_TABLET | Freq: Three times a day (TID) | ORAL | Status: DC | PRN
  Administered 2021-01-16 – 2021-01-17 (×4): 50 mg via ORAL
  Filled 2021-01-16 (×4): qty 1

## 2021-01-16 MED ORDER — THIAMINE HCL 100 MG PO TABS: 100.0000 mg | ORAL_TABLET | Freq: Every day | ORAL | Status: DC

## 2021-01-16 MED ORDER — POTASSIUM CHLORIDE CRYS ER 20 MEQ PO TBCR
40.0000 meq | EXTENDED_RELEASE_TABLET | Freq: Once | ORAL | Status: AC
  Administered 2021-01-16: 40 meq via ORAL
  Filled 2021-01-16: qty 2

## 2021-01-16 MED ORDER — METHADONE HCL 10 MG PO TABS
10.0000 mg | ORAL_TABLET | Freq: Every day | ORAL | Status: DC
  Administered 2021-01-16 – 2021-01-17 (×2): 10 mg via ORAL
  Filled 2021-01-16 (×2): qty 1

## 2021-01-16 MED ORDER — NICOTINE 21 MG/24HR TD PT24
21.0000 mg | MEDICATED_PATCH | Freq: Every day | TRANSDERMAL | Status: DC
  Administered 2021-01-16 – 2021-01-17 (×2): 21 mg via TRANSDERMAL
  Filled 2021-01-16 (×2): qty 1

## 2021-01-16 NOTE — Progress Notes (Signed)
Multiple attempts to educate the patient this shift regarding the fact that she cannot use her vape pen within the hospital. The "pod" for the vape pen has been removed and placed in the window seal so the patient cannot re-insert the pod into the pen. The patient has been educated that it is against hospital policy for her to use the vape pen and that she does have a nicotine patch in place to help with the cravings for nicotine. The patient has insisted that she hold the empty vape pen in her hand as she says it is a "comfort thing." The patient has also asked that the empty vape pen be charged. The patient was educated yet again that even if the pen is charged she still cannot use the pen. Patient has also asked multiple times during this shift for this RN to let her "have just one hit" from the vape pen. This RN has refused and continually reinforces the policy that vape pens are NOT allowed. Will report to on coming shift.

## 2021-01-16 NOTE — TOC Initial Note (Signed)
Transition of Care [facility]) - Initial/Assessment Note    Patient Details  Name: Heather Shepherd MRN: [id number] Date of Birth: August 20, 1990  [facility] CM/SW Contact:    Tom-Johnson, Hershal Coria, RN Phone Number: 01/16/2021, 3:51 PM  Clinical Narrative:                 CM went to speak with patient at bedside about needs for post hospital transition. Patient declined assessment, stating she is not in the mood to talk. CM asked if the assessment can be done later in the day and patient states maybe tomorrow. CM will continue to follow with needs.    Barriers to Discharge: Continued Medical Work up   Patient Goals and CMS Choice        Expected Discharge Plan and Services         Living arrangements for the past 2 months: Single Family Home                                      Prior Living Arrangements/Services Living arrangements for the past 2 months: Single Family Home                     Activities of Daily Living      Permission Sought/Granted                  Emotional Assessment              Admission diagnosis:  Alcohol withdrawal (HCC) [F10.939] Polysubstance abuse (HCC) [F19.10] Chest pain [R07.9] Alcohol withdrawal syndrome, with delirium (HCC) [F10.931] Patient Active Problem List   Diagnosis Date Noted   Polysubstance abuse (HCC) 01/14/2021   Alcohol withdrawal (HCC) 01/13/2021   Back pain 12/24/2011   Preventative health care 11/11/2011   Tobacco abuse 11/11/2011   Anxiety and depression 11/08/2011   Dysmenorrhea    PCP:  Bradd Canary, MD Pharmacy:   [phone] - [place], [place] - [address] [place] [address] Phone: [phone] Fax: [phone]     Social Determinants of Health (SDOH) Interventions    Readmission Risk Interventions No flowsheet data found.

## 2021-01-16 NOTE — Progress Notes (Signed)
NAME:  Heather Shepherd, MRN:  [id number], DOB:  March 28, 1990, LOS: 3 ADMISSION DATE:  01/13/2021, CONSULTATION DATE:  01/14/21 REFERRING MD:  Dr. Georgetta Haber, CHIEF COMPLAINT:  ETOH withdrawal   History of Present Illness:  30 yo female with hx of ETOH and polysubstance abuse presented to ER for detox.  Drinks a 5th of vodka daily and abuses fentanyl.  ETOH level < 10 on admission.  Developed agitation and tremor in ER.  Not improved with CIWA protocol  transferred to ICU and started on precedex.  Pertinent  Medical History  Substance abuse  Significant Hospital Events: Including procedures, antibiotic start and stop dates in addition to other pertinent events   10/29 admit, start precedex  Interim History / Subjective:  Awake and more oriented this am. C/o pain all over, wants something for heartburn.   Objective   Blood pressure (!) 115/92, pulse 66, temperature 98.2 F (36.8 C), temperature source Oral, resp. rate 10, height 5\' 4"  (1.626 m), weight 64.1 kg, SpO2 98 %.        Intake/Output Summary (Last 24 hours) at 01/16/2021 0831 Last data filed at 01/16/2021 0600 Gross per 24 hour  Intake 563.48 ml  Output 300 ml  Net 263.48 ml   Filed Weights   01/13/21 1913 01/14/21 0500 01/16/21 0157  Weight: 68 kg 65 kg 64.1 kg    Examination:  General - awake, alert, tearful Eyes - pupils reactive ENT - mm moist, no JVD, no stridor Cardiac - regular rate/rhythm, no murmur Chest - resps even non labored on [place], clear  Abdomen - soft, non tender, + bowel sounds Extremities - no cyanosis, clubbing, or edema Skin - no rashes Neuro - oriented to person and time  Resolved Hospital Problem list     Assessment & Plan:   Delirium tremens in setting of ETOH and fentanyl withdrawal. - continue librium 50 mg q6h and wean off as tolerated - prn ativan for CIWA > 8 - wean precedex to keep RASS 0 - thiamine, folic acid, MVI - HIV test negative -thiamine, folate, mvi   Elevated LFTs in  setting of ETOH. - improving - f/u hepatitis panel from 10/30 (still pending 10/31)  Leukopenia, macrocytic anemia, thrombocytopenia. Plt 108->127 - likely all related to ETOH - f/u CBC intermittently  Hypokalemia, hypophosphatemia. - replace electrolytes -f/u chem   Best Practice (right click and "Reselect all SmartList Selections" daily)   Diet/type: Regular consistency (see orders) DVT prophylaxis: prophylactic heparin  GI prophylaxis: PPI Lines: N/A Foley:  N/A Code Status:  full code Last date of multidisciplinary goals of care discussion [x]   Labs    CMP Latest Ref Rng & Units 01/16/2021 01/15/2021 01/14/2021  Glucose 70 - 99 mg/dL 01/17/2021) 01/16/2021) 678(L)  BUN 6 - 20 mg/dL 381(O) 175(Z) 5(L)  Creatinine 0.44 - 1.00 mg/dL <0(C <5(E 5.27  Sodium 135 - 145 mmol/L 134(L) 137 138  Potassium 3.5 - 5.1 mmol/L 2.9(L) 3.3(L) 3.4(L)  Chloride 98 - 111 mmol/L 104 105 101  CO2 22 - 32 mmol/L 20(L) 19(L) 25  Calcium 8.9 - 10.3 mg/dL 7.82) 9.0 4.23)  Total Protein 6.5 - 8.1 g/dL - 7.0 6.7  Total Bilirubin 0.3 - 1.2 mg/dL - 1.9(H) 2.2(H)  Alkaline Phos 38 - 126 U/L - 89 80  AST 15 - 41 U/L - 194(H) 358(H)  ALT 0 - 44 U/L - 96(H) 108(H)    CBC Latest Ref Rng & Units 01/16/2021 01/14/2021 01/14/2021  WBC 4.0 - 10.5 K/uL 4.7  3.1(L) 3.6(L)  Hemoglobin 12.0 - 15.0 g/dL 12.6 11.1(L) 11.7(L)  Hematocrit 36.0 - 46.0 % 34.8(L) 32.2(L) 33.0(L)  Platelets 150 - 400 K/uL 127(L) 108(L) 111(L)    Critical care time: 32 minutes   Nickolas Madrid, NP Pulmonary/Critical Care Medicine  01/16/2021  8:31 AM

## 2021-01-16 NOTE — Progress Notes (Signed)
[facility] ADULT ICU REPLACEMENT PROTOCOL   The patient does apply for the [facility] Adult ICU Electrolyte Replacment Protocol based on the criteria listed below:   1.Exclusion criteria: TCTS patients, ECMO patients, and Dialysis patients 2. Is GFR >/= 30 ml/min? Yes.    Patient's GFR today is >60 3. Is SCr </= 2? Yes.   Patient's SCr is 0.63 mg/dL 4. Did SCr increase >/= 0.5 in 24 hours? No. 5.Pt's weight >40kg  Yes.   6. Abnormal electrolyte(s): K+2.9, Mag 1.8  7. Electrolytes replaced per protocol 8.  Call MD STAT for K+ </= 2.5, Phos </= 1, or Mag </= 1 Physician:  Lebron Conners 01/16/2021 6:21 AM

## 2021-01-16 NOTE — Progress Notes (Addendum)
Attempted to give the patients mother and update via phone call. The number listed in the chart (the same as the number left with the secretary earlier in the shift) goes straight to voicemail. Will attempt to call again at a later time.   1734 patient's mother called this RN back and was able to get an update. She would like to speak with social work if possible tomorrow.

## 2021-01-16 NOTE — Progress Notes (Signed)
Very anxious, tearful C/o pain, anxiety , 'these restraints are making it worse ' " I came here voluntarily for detox' Given ativan with mild relief qTC 340 ,will add methadon 10 mg Continue precedex   Calieb Lichtman V. Vassie Loll MD

## 2021-01-16 NOTE — Progress Notes (Signed)
Patient in bed with vape pen in hand. This RN asked the patient about the vape pen and patient stated she didn't have one. When I pointed it out and told the patient there is no smoking or vaping in the hospital the patient pulled the cartridge out of the vape pen and handed that to this RN. This RN asked for the rest of the pen and the patient states she wants to keep it as a "comfort thing." This RN explained again that no one is allowed to smoke or use vape pens in the hospital for any reason. The patient then handed this RN the rest of the vape pen and get very agitated and asking to leave the hospital and stating she does not want to be here anymore and that she "wants to get alcohol, a bag of dope, and some nicotine." This RN notified the MD who states he will come to bedside to speak with the patient. Will continue to monitor.

## 2021-01-16 NOTE — Progress Notes (Signed)
Patient screaming in room and then this RN visualized patient getting out of bed as bed exit is going off. The patient stumbled over to her bag of belongings in the floor of her room. Patient's IV line and monitoring cords were pulled tight but none disconnected. The patient was encouraged to get back to the bed. The patient states she wants her belongings in bed with her and that she is looking for something specific but when asked what she is looking for the patient says "nothing." The patient continues to empty the bag with this RN standing at bedside. The patient pulled a box cutter knife out of the bag and states "I'm probably not allowed to have that." This RN called security and they placed the knife in a box in security. The patient was also noted to have boxes that states there is nicotine in the product. This RN reminds the patient that she cannot vape in the hospital and cannot have those in her possession. The patient states the boxes are empty and asks this RN to throw them away. The patient emptied the whole bag on the bed continually stating "I can't find it anywhere." When this RN again asks what she is looking for she states "nothing." The patient then requests to leave the bag in the bed with her and not have it go to security. This RN explained the bag could stay in the room but could not stay in the bed with her. The patient refused to have this RN place the bag back where it was and states she had to do it. Once the patient stands back and the bedside the patient states she is dizzy. The patient was assisted back to bed and feels better once laid down.

## 2021-01-17 ENCOUNTER — Encounter (HOSPITAL_COMMUNITY): Payer: Self-pay

## 2021-01-17 ENCOUNTER — Emergency Department (HOSPITAL_COMMUNITY)
Admission: EM | Admit: 2021-01-17 | Discharge: 2021-01-18 | Disposition: A | Discharge status: Home / Self Care | Payer: Self-pay | Attending: Emergency Medicine | Admitting: Emergency Medicine

## 2021-01-17 ENCOUNTER — Emergency Department (HOSPITAL_COMMUNITY): Payer: Self-pay

## 2021-01-17 ENCOUNTER — Other Ambulatory Visit: Payer: Self-pay

## 2021-01-17 DIAGNOSIS — F1010 Alcohol abuse, uncomplicated: Secondary | ICD-10-CM

## 2021-01-17 DIAGNOSIS — F332 Major depressive disorder, recurrent severe without psychotic features: Secondary | ICD-10-CM | POA: Insufficient documentation

## 2021-01-17 DIAGNOSIS — F1910 Other psychoactive substance abuse, uncomplicated: Secondary | ICD-10-CM

## 2021-01-17 DIAGNOSIS — F17210 Nicotine dependence, cigarettes, uncomplicated: Secondary | ICD-10-CM | POA: Insufficient documentation

## 2021-01-17 DIAGNOSIS — S8012XA Contusion of left lower leg, initial encounter: Secondary | ICD-10-CM | POA: Insufficient documentation

## 2021-01-17 DIAGNOSIS — F1193 Opioid use, unspecified with withdrawal: Secondary | ICD-10-CM

## 2021-01-17 DIAGNOSIS — N9489 Other specified conditions associated with female genital organs and menstrual cycle: Secondary | ICD-10-CM | POA: Insufficient documentation

## 2021-01-17 DIAGNOSIS — S0083XA Contusion of other part of head, initial encounter: Secondary | ICD-10-CM | POA: Insufficient documentation

## 2021-01-17 DIAGNOSIS — S40021A Contusion of right upper arm, initial encounter: Secondary | ICD-10-CM | POA: Insufficient documentation

## 2021-01-17 DIAGNOSIS — F10120 Alcohol abuse with intoxication, uncomplicated: Secondary | ICD-10-CM | POA: Insufficient documentation

## 2021-01-17 DIAGNOSIS — S40022A Contusion of left upper arm, initial encounter: Secondary | ICD-10-CM | POA: Insufficient documentation

## 2021-01-17 DIAGNOSIS — Z79899 Other long term (current) drug therapy: Secondary | ICD-10-CM | POA: Insufficient documentation

## 2021-01-17 DIAGNOSIS — Y907 Blood alcohol level of 200-239 mg/100 ml: Secondary | ICD-10-CM | POA: Insufficient documentation

## 2021-01-17 DIAGNOSIS — S8011XA Contusion of right lower leg, initial encounter: Secondary | ICD-10-CM | POA: Insufficient documentation

## 2021-01-17 DIAGNOSIS — W19XXXA Unspecified fall, initial encounter: Secondary | ICD-10-CM | POA: Insufficient documentation

## 2021-01-17 DIAGNOSIS — F19129 Other psychoactive substance abuse with intoxication, unspecified: Secondary | ICD-10-CM | POA: Insufficient documentation

## 2021-01-17 LAB — CBC WITH DIFFERENTIAL/PLATELET
Abs Immature Granulocytes: 0.05 10*3/uL (ref 0.00–0.07)
Basophils Absolute: 0 10*3/uL (ref 0.0–0.1)
Basophils Relative: 1 %
Eosinophils Absolute: 0 10*3/uL (ref 0.0–0.5)
Eosinophils Relative: 0 %
HCT: 41.4 % (ref 36.0–46.0)
Hemoglobin: 13.9 g/dL (ref 12.0–15.0)
Immature Granulocytes: 1 %
Lymphocytes Relative: 26 %
Lymphs Abs: 1.3 10*3/uL (ref 0.7–4.0)
MCH: 37.3 pg — ABNORMAL HIGH (ref 26.0–34.0)
MCHC: 33.6 g/dL (ref 30.0–36.0)
MCV: 111 fL — ABNORMAL HIGH (ref 80.0–100.0)
Monocytes Absolute: 0.5 10*3/uL (ref 0.1–1.0)
Monocytes Relative: 10 %
Neutro Abs: 3.1 10*3/uL (ref 1.7–7.7)
Neutrophils Relative %: 62 %
Platelets: 210 10*3/uL (ref 150–400)
RBC: 3.73 MIL/uL — ABNORMAL LOW (ref 3.87–5.11)
RDW: 12.8 % (ref 11.5–15.5)
WBC: 4.9 10*3/uL (ref 4.0–10.5)
nRBC: 0 % (ref 0.0–0.2)

## 2021-01-17 LAB — BASIC METABOLIC PANEL
Anion gap: 6 (ref 5–15)
BUN: <5 mg/dL — ABNORMAL LOW (ref 6–20)
CO2: 22 mmol/L (ref 22–32)
Calcium: 9.1 mg/dL (ref 8.9–10.3)
Chloride: 108 mmol/L (ref 98–111)
Creatinine, Ser: 0.52 mg/dL (ref 0.44–1.00)
GFR, Estimated: >60 mL/min (ref >60)
Glucose, Bld: 117 mg/dL — ABNORMAL HIGH (ref 70–99)
Potassium: 4.2 mmol/L (ref 3.5–5.1)
Sodium: 136 mmol/L (ref 135–145)

## 2021-01-17 LAB — URINALYSIS, ROUTINE W REFLEX MICROSCOPIC
Bacteria, UA: NONE SEEN
Bilirubin Urine: NEGATIVE
Glucose, UA: NEGATIVE mg/dL
Hgb urine dipstick: NEGATIVE
Ketones, ur: NEGATIVE mg/dL
Nitrite: NEGATIVE
Protein, ur: NEGATIVE mg/dL
Specific Gravity, Urine: 1.002 — ABNORMAL LOW (ref 1.005–1.030)
pH: 6 (ref 5.0–8.0)

## 2021-01-17 LAB — CBC
HCT: 33.7 % — ABNORMAL LOW (ref 36.0–46.0)
Hemoglobin: 12 g/dL (ref 12.0–15.0)
MCH: 38.2 pg — ABNORMAL HIGH (ref 26.0–34.0)
MCHC: 35.6 g/dL (ref 30.0–36.0)
MCV: 107.3 fL — ABNORMAL HIGH (ref 80.0–100.0)
Platelets: 141 10*3/uL — ABNORMAL LOW (ref 150–400)
RBC: 3.14 MIL/uL — ABNORMAL LOW (ref 3.87–5.11)
RDW: 12.5 % (ref 11.5–15.5)
WBC: 4.7 10*3/uL (ref 4.0–10.5)
nRBC: 0 % (ref 0.0–0.2)

## 2021-01-17 LAB — COMPREHENSIVE METABOLIC PANEL
ALT: 61 U/L — ABNORMAL HIGH (ref 0–44)
AST: 54 U/L — ABNORMAL HIGH (ref 15–41)
Albumin: 4.5 g/dL (ref 3.5–5.0)
Alkaline Phosphatase: 88 U/L (ref 38–126)
Anion gap: 11 (ref 5–15)
BUN: 8 mg/dL (ref 6–20)
CO2: 22 mmol/L (ref 22–32)
Calcium: 10.2 mg/dL (ref 8.9–10.3)
Chloride: 105 mmol/L (ref 98–111)
Creatinine, Ser: 0.58 mg/dL (ref 0.44–1.00)
GFR, Estimated: >60 mL/min (ref >60)
Glucose, Bld: 102 mg/dL — ABNORMAL HIGH (ref 70–99)
Potassium: 4.6 mmol/L (ref 3.5–5.1)
Sodium: 138 mmol/L (ref 135–145)
Total Bilirubin: 0.9 mg/dL (ref 0.3–1.2)
Total Protein: 8.1 g/dL (ref 6.5–8.1)

## 2021-01-17 LAB — GLUCOSE, CAPILLARY: Glucose-Capillary: 123 mg/dL — ABNORMAL HIGH (ref 70–99)

## 2021-01-17 LAB — I-STAT BETA HCG BLOOD, ED (MC, WL, AP ONLY): I-stat hCG, quantitative: <5 m[IU]/mL (ref <5)

## 2021-01-17 LAB — RAPID URINE DRUG SCREEN, HOSP PERFORMED
Amphetamines: NOT DETECTED
Barbiturates: POSITIVE — AB
Benzodiazepines: POSITIVE — AB
Cocaine: NOT DETECTED
Opiates: NOT DETECTED
Tetrahydrocannabinol: NOT DETECTED

## 2021-01-17 LAB — ETHANOL: Alcohol, Ethyl (B): 200 mg/dL — ABNORMAL HIGH (ref <10)

## 2021-01-17 MED ORDER — ONDANSETRON HCL 4 MG PO TABS: 4.0000 mg | ORAL_TABLET | Freq: Three times a day (TID) | ORAL | Status: DC | PRN

## 2021-01-17 MED ORDER — MELATONIN 5 MG PO TABS: 5.0000 mg | ORAL_TABLET | Freq: Every day | ORAL | Status: DC

## 2021-01-17 MED ORDER — STERILE WATER FOR INJECTION IJ SOLN
INTRAMUSCULAR | Status: AC
  Administered 2021-01-17: 1 mL
  Filled 2021-01-17: qty 10

## 2021-01-17 MED ORDER — MELATONIN 3 MG PO TABS: 3.0000 mg | ORAL_TABLET | Freq: Every day | ORAL | Status: DC

## 2021-01-17 MED ORDER — LORAZEPAM 1 MG PO TABS
0.0000 mg | ORAL_TABLET | Freq: Four times a day (QID) | ORAL | Status: DC
  Administered 2021-01-18: 1 mg via ORAL
  Administered 2021-01-18: 2 mg via ORAL
  Filled 2021-01-17: qty 2
  Filled 2021-01-17: qty 1

## 2021-01-17 MED ORDER — THIAMINE HCL 100 MG PO TABS
100.0000 mg | ORAL_TABLET | Freq: Every day | ORAL | Status: DC
  Administered 2021-01-18: 100 mg via ORAL
  Filled 2021-01-17 (×2): qty 1

## 2021-01-17 MED ORDER — NICOTINE 14 MG/24HR TD PT24
14.0000 mg | MEDICATED_PATCH | Freq: Once | TRANSDERMAL | Status: DC
  Administered 2021-01-18: 14 mg via TRANSDERMAL
  Filled 2021-01-17: qty 1

## 2021-01-17 MED ORDER — ZIPRASIDONE MESYLATE 20 MG IM SOLR
20.0000 mg | Freq: Once | INTRAMUSCULAR | Status: AC
  Administered 2021-01-17: 20 mg via INTRAMUSCULAR
  Filled 2021-01-17: qty 20

## 2021-01-17 MED ORDER — LACTATED RINGERS IV BOLUS
1000.0000 mL | Freq: Once | INTRAVENOUS | Status: AC
  Administered 2021-01-17: 1000 mL via INTRAVENOUS

## 2021-01-17 MED ORDER — THIAMINE HCL 100 MG/ML IJ SOLN
100.0000 mg | Freq: Every day | INTRAMUSCULAR | Status: DC
  Administered 2021-01-17: 100 mg via INTRAVENOUS
  Filled 2021-01-17: qty 2

## 2021-01-17 MED ORDER — MELATONIN 5 MG PO TABS
5.0000 mg | ORAL_TABLET | Freq: Once | ORAL | Status: AC
  Administered 2021-01-17: 5 mg via ORAL
  Filled 2021-01-17: qty 1

## 2021-01-17 MED ORDER — LORAZEPAM 2 MG/ML IJ SOLN
1.0000 mg | Freq: Once | INTRAMUSCULAR | Status: AC
  Administered 2021-01-17: 1 mg via INTRAMUSCULAR
  Filled 2021-01-17: qty 1

## 2021-01-17 MED ORDER — LORAZEPAM 2 MG/ML IJ SOLN
0.0000 mg | Freq: Four times a day (QID) | INTRAMUSCULAR | Status: DC
  Administered 2021-01-18 (×2): 2 mg via INTRAVENOUS
  Filled 2021-01-17 (×3): qty 1
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
## 2021-01-17 NOTE — Progress Notes (Signed)
NAME:  Heather Shepherd, MRN:  [id number], DOB:  [birth year], LOS: 4 ADMISSION DATE:  01/13/2021, CONSULTATION DATE:  01/14/21 REFERRING MD:  Dr. Jaquita Rector, CHIEF COMPLAINT:  ETOH withdrawal   History of Present Illness:  30 yo female with hx of ETOH and polysubstance abuse presented to ER for detox.  Drinks a 5th of vodka daily and abuses fentanyl.  ETOH level < 10 on admission.  Developed agitation and tremor in ER.  Not improved with CIWA protocol  transferred to ICU and started on precedex.  Pertinent  Medical History  Substance abuse  Significant Hospital Events: Including procedures, antibiotic start and stop dates in addition to other pertinent events   10/29 admit, start precedex 11/1 Precedex weaned off  Interim History / Subjective:  Awake and more oriented this am. Reports still having a lot of tremors and anxiety. Asking about having 2 visitors.  Precedex weaned off at 0800 today  Objective   Blood pressure 98/71, pulse 76, temperature 97.9 F (36.6 C), temperature source Oral, resp. rate (!) 23, height 5\' 4"  (1.626 m), weight 64.1 kg, SpO2 100 %.        Intake/Output Summary (Last 24 hours) at 01/17/2021 1008 Last data filed at 01/17/2021 0900 Gross per 24 hour  Intake 263.49 ml  Output 650 ml  Net -386.51 ml    Filed Weights   01/13/21 1913 01/14/21 0500 01/16/21 0157  Weight: 68 kg 65 kg 64.1 kg    Examination:  General - awake, alert Eyes - pupils reactive ENT - no stridor Cardiac - RRR Chest - CTAB, resps even non labored on room air Abdomen - soft, non tender, + bowel sounds Extremities - no cyanosis, clubbing, or edema Skin - no rashes Neuro - alert, interactive. Bilateral fine hand tremor  Labs show unremarkable BMP, macrocytosis, normal hemoglobin, normal WBC, improving thrombocytopenia Qtc 392 on monitor  Resolved Hospital Problem list   Leukopenia, macrocytic anemia  Assessment & Plan:   Delirium tremens in setting of ETOH and fentanyl  withdrawal. - improving, Precedex weaned off, likely stable for floor if can remain off Precedex - continue librium 50 mg q6h and wean off as tolerated - prn ativan for CIWA > 8 - continue methadone daily for total of 3 days - thiamine, folic acid, MVI - HIV test negative  Elevated LFTs in setting of ETOH. - improving - hepatitis panel negative  Macrocytosis, thrombocytopenia - likely all related to ETOH - platelets improving 127->141 - f/u CBC intermittently  Hypokalemia, hypophosphatemia. - replace electrolytes -f/u chem   Best Practice (right click and "Reselect all SmartList Selections" daily)   Diet/type: Regular consistency (see orders) DVT prophylaxis: prophylactic heparin  GI prophylaxis: N/A Lines: N/A Foley:  N/A Code Status:  full code Last date of multidisciplinary goals of care discussion [x]   Labs    CMP Latest Ref Rng & Units 01/17/2021 01/16/2021 01/15/2021  Glucose 70 - 99 mg/dL 117(H) 141(H) 118(H)  BUN 6 - 20 mg/dL <5(L) <5(L) <5(L)  Creatinine 0.44 - 1.00 mg/dL 0.52 0.63 0.72  Sodium 135 - 145 mmol/L 136 134(L) 137  Potassium 3.5 - 5.1 mmol/L 4.2 2.9(L) 3.3(L)  Chloride 98 - 111 mmol/L 108 104 105  CO2 22 - 32 mmol/L 22 20(L) 19(L)  Calcium 8.9 - 10.3 mg/dL 9.1 8.7(L) 9.0  Total Protein 6.5 - 8.1 g/dL - - 7.0  Total Bilirubin 0.3 - 1.2 mg/dL - - 1.9(H)  Alkaline Phos 38 - 126 U/L - - 89  AST  15 - 41 U/L - - 194(H)  ALT 0 - 44 U/L - - 96(H)    CBC Latest Ref Rng & Units 01/17/2021 01/16/2021 01/14/2021  WBC 4.0 - 10.5 K/uL 4.7 4.7 3.1(L)  Hemoglobin 12.0 - 15.0 g/dL 41.9 37.9 11.1(L)  Hematocrit 36.0 - 46.0 % 33.7(L) 34.8(L) 32.2(L)  Platelets 150 - 400 K/uL 141(L) 127(L) 108(L)    Critical care time: ** minutes   Littie Deeds, MD PGY-2 [facility] 01/17/2021  10:08 AM

## 2021-01-17 NOTE — ED Triage Notes (Signed)
Pt BIB EMS. Pt was picked up from [employer] ETOH. Pt kept falling and citizens were concerned. [facility] left A[facility] from [facility] at 1630 with ETOH.

## 2021-01-17 NOTE — Progress Notes (Incomplete)
Pt informed that she cannot use her vape pen on hospital premises. The "pod" has been removed from pen prior shift. However, Pt was found to possess another pod and attempts to use. Pod has been confiscated and placed in denture bin on

## 2021-01-17 NOTE — Progress Notes (Signed)
eLink Physician-Brief Progress Note Patient Name: Heather Shepherd DOB: 01-02-1991 MRN: [id number]   Date of Service  01/17/2021  HPI/Events of Note  Agitated and asking for medications to help her sleep  eICU Interventions  Bedside is increasing precedex and giving Ativan Melatonin x 1  ordered      Intervention Category Minor Interventions: Agitation / anxiety - evaluation and management  Oretha Milch 01/17/2021, 12:19 AM
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
## 2021-01-18 MED ORDER — MELATONIN 3 MG PO TABS
3.0000 mg | ORAL_TABLET | Freq: Every day | ORAL | Status: DC
  Administered 2021-01-18: 3 mg via ORAL
  Filled 2021-01-18: qty 1

## 2021-01-18 NOTE — ED Notes (Signed)
Nurse instructed by PA to do CIWA hourly due to patient actively going through detox. IV and fluids started by PA.
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
## 2021-01-18 NOTE — ED Notes (Signed)
Gave report to Bertram Millard, Rn.

## 2021-01-18 NOTE — Progress Notes (Signed)
Left arm PIV was removed before patient left AMA.  Heather Shepherd

## 2021-01-18 NOTE — BH Assessment (Signed)
Clinician sent an internal message to pt's team at 0526 requesting the Tele-Assessment machine be moved into pt's room to complete her MH Assessment. Per notes, pt was given Geodon at 2322. Pt's nurse, Morrell Riddle RN, responded at [phone] and requested pt's assessment be completed at a later time. TTS will attempt assessment at a later time.

## 2021-01-18 NOTE — Discharge Instructions (Signed)
Substance Abuse Treatment Programs ° °Intensive Outpatient Programs °[facility]     °[address]      °[place], [place]                   °[phone]      ° °[facility] °[address] °[place], [place] °[phone] ° °[facility]     °(Inpatient and outpatient)     °[address]           °[phone]   ° °[facility] °336-288-1484 (Suboxone and Methadone) ° °[address]      °[place], [place] [address]      °336-882-2125      ° °[address] °[place], [place] °852-3033 ° °[facility] (Outpatient/Inpatient, Chemical)    °(insurance only) [phone]      °       °[facility] (Groups & Residential) °[place], [place] °336-389-1413 ° °   °[facility]     °[address]     °[place], [place]      °336-389-1413      ° °[facility] (for caregivers and family) °[address] °[place], [place] °336-299-4655 ° ° ° ° ° °Residential Treatment Programs °[facility]      °[address], , [place] [address]  °(336) [phone]      ° °[facility] °[address] [place], [place] [address] °[phone] ° °[facility]        °336-248-8914      ° °[facility] °1-800-659-3381 ° °[facility]             °[address]                                         °[place], [place]                                                °877-615-2722 or 336-784-9470                              ° °[facility] [place] °[address] °[place], [address] °1.877.941.8954 ° °[facility]    °[address]      °, [place]     °336-273-5306      ° °[facility] °[address] °, [place] °336-285-9073 ° °[facility]   °[address]     °[place], [place] [address]     °336-899-1550      °Admissions: 8am-3pm M-F ° °[facility] °[address] °[place],  [place] °336-227-7417 ° °[facility]: Residential Program (90 Days)   °[place], [place]      °336-725-8389 or 800-758-6077    ° °[facility] °[place], [place] °(Walk in Hours over the weekend or by referral) ° °[facility] °[address], [place], [place] [address] °([phone] ° °Crisis Mobile: [facility]:  1-877-626-1772 (for crisis response 24 hours a day) °[facility] Hotline:      1-800-256-2452 °Outpatient Psychiatry and Counseling ° °[facility]: Mobile Crisis   Management 24 hours:  1-877-626-1772 ° °[facility] sliding scale fee and walk in schedule: M-F 8am-12pm/1pm-3pm °[address]  °[place], [place] [address] °336-387-6161 ° °[facility] °[address] °[place], [place] [address] °336-703-9650 ° °[facility] ([facility] new patient walk-in appointments available Monday - Friday 8am -3pm.          °[address] °[place], [place] [address] °336-676-6840 or crisis line- 336-676-6905 ° °[facility] Intensive Outpatient Therapy Program °[address] °[place], [place] [address] °336-832-9804 ° °[facility]                  °Crisis Services      °336.641.4993      °[address]     °[place], [place] [address]                ° °[facility]   °[facility] °[phone] °[address] °[place], [place] [address] ° ° °[facility]          °[address],  °[place], [place] [address]       °(336) [phone] ° °[facility] °[address] °[place], [place] [address] °336-292-1510 ° °[facility]    °[address]    °[place], [place] [address]     °336-632-3505      ° °Parish McKinney, MD     °[address]     °[place] [address]     °336-282-1251     °  °[facility] °[address] °[place] [address] ° °[facility]     °[address]     °[place], [place]      °336-542-2076      ° °[facility] °Shamsher Ahluwalia, MD °[address] °[place], [place] [address] °336-420-9558 ° °[facility]     °[address]     °[place], [place] [address]     °336-274-1237      ° °[facility] °[address] °[place], [place] [address] °336-854-4450 °Resources for Temporary Residential Assistance/Crisis Centers ° °DAY CENTERS °[facility] °M-F 8am-3pm   °[address] [place], [place] [address]   336-332-0824 °Services include: laundry, barbering, support groups, case management, phone  & computer access, showers, AA/NA mtgs, mental health/substance abuse nurse, job skills class, disability information, VA assistance, spiritual classes, etc.  ° °HOMELESS SHELTERS ° °[facility]     °[facility]   °[address], [place]     °[phone]       °       °[facility] (women and children)       °[address] °[place], [place] [address] °336-275-0820 °[email or web address] for application and process °Application Required ° °[facility]   °[address]    °[place] [address]     °[phone]       °             °[facility] °[address] °[place], [place] [address] °336.273.5572 °336-235-0363(schedule application appt.) °Application Required ° °[facility] (women only)    °[address]     °[place],  [address]     °336-884-1039      °  Intake starts 6pm daily °Need valid ID, SSC, & Police report °[facility] [place] °[address] °[place], [place] °336-881-5420 °Application Required ° °[facility] (men only)     °[address]      °[place], [place]     °[phone]      ° °[facility] °(Pregnant women only) °[address] °[place], [place] °336-275-0206 ° °[facility]      °[address]      °[place], [place] [address]     °336-722-9951      °       °[facility] °[address] °[place], [place] °336-723-1848 °90 day commitment/SA/Application process ° °[facility](men only)     °[address]     °[place], [place]     °[phone]       °Check-in at 7pm     °       °[facility] °[address] °[place], [place] [address] °336-248-6684 °Men/Women/Women and Children must be there by 7 pm ° °[facility] °[place], [place] °336-722-8721                ° °

## 2021-01-18 NOTE — ED Provider Notes (Signed)
Emergency Medicine Observation Re-evaluation Note  Heather Shepherd is a 30 y.o. female, seen on rounds today.  Pt initially presented to the ED for complaints of Alcohol Intoxication Patient with history of polysubstance abuse, presenting for altered mentation.  EtOH level 200.  IVC initiated due to patient's unwillingness to stay.  She will require reevaluation after metabolizing.  Currently, the patient is awaiting reevaluation.  Physical Exam  BP 110/60   Pulse 100   Temp 99 F (37.2 C) (Oral)   Resp 20   SpO2 97%  Physical Exam General: Awake, alert Cardiac: Normal rate and rhythm Lungs: Breathing even and unlabored Psych: Denies SI, HI, AVH  ED Course / MDM  EKG:   I have reviewed the labs performed to date as well as medications administered while in observation.  Recent changes in the last 24 hours include patient presented intoxicated.  She endorses alcohol and fentanyl use.  Currently denies any physical or psychiatric symptoms.  Requests discharge.  States that her mother will pick her up and she will go to [facility].  Plan  Current plan is for discharge.  Heather Shepherd is not under involuntary commitment.     Heather Manchester, MD 01/18/21 [phone]

## 2021-01-18 NOTE — ED Notes (Signed)
Patient proceeded out of the room  Patient redirected back to the bed Patient stated "she has always had trouble sleeping and this is why she drinks" Played "soft Harp music" in background with patients approval for stress relief

## 2021-01-18 NOTE — ED Notes (Signed)
Patient woke up and proceeded to the nurses station searching for her "vape"  Re-directed patient back to room Patient stated that she needs something to sleep RN notified
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
## 2021-01-18 NOTE — ED Notes (Signed)
Bed alarm on.
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
## 2022-10-12 ENCOUNTER — Emergency Department (HOSPITAL_BASED_OUTPATIENT_CLINIC_OR_DEPARTMENT_OTHER): Payer: Self-pay

## 2022-10-12 ENCOUNTER — Other Ambulatory Visit: Payer: Self-pay

## 2022-10-12 ENCOUNTER — Encounter (HOSPITAL_BASED_OUTPATIENT_CLINIC_OR_DEPARTMENT_OTHER): Payer: Self-pay | Admitting: Emergency Medicine

## 2022-10-12 ENCOUNTER — Inpatient Hospital Stay (HOSPITAL_BASED_OUTPATIENT_CLINIC_OR_DEPARTMENT_OTHER)
Admission: EM | Admit: 2022-10-12 | Discharge: 2022-10-20 | DRG: 896 | Disposition: A | Discharge status: Other Acute Inpatient Hospital | Payer: Self-pay | Attending: Family Medicine | Admitting: Family Medicine

## 2022-10-12 DIAGNOSIS — E871 Hypo-osmolality and hyponatremia: Secondary | ICD-10-CM | POA: Diagnosis not present

## 2022-10-12 DIAGNOSIS — D72819 Decreased white blood cell count, unspecified: Secondary | ICD-10-CM | POA: Diagnosis present

## 2022-10-12 DIAGNOSIS — F10229 Alcohol dependence with intoxication, unspecified: Secondary | ICD-10-CM | POA: Diagnosis present

## 2022-10-12 DIAGNOSIS — F32A Depression, unspecified: Secondary | ICD-10-CM | POA: Diagnosis present

## 2022-10-12 DIAGNOSIS — K760 Fatty (change of) liver, not elsewhere classified: Secondary | ICD-10-CM | POA: Diagnosis present

## 2022-10-12 DIAGNOSIS — G928 Other toxic encephalopathy: Secondary | ICD-10-CM | POA: Diagnosis present

## 2022-10-12 DIAGNOSIS — F319 Bipolar disorder, unspecified: Secondary | ICD-10-CM | POA: Diagnosis present

## 2022-10-12 DIAGNOSIS — Z833 Family history of diabetes mellitus: Secondary | ICD-10-CM

## 2022-10-12 DIAGNOSIS — D638 Anemia in other chronic diseases classified elsewhere: Secondary | ICD-10-CM | POA: Diagnosis present

## 2022-10-12 DIAGNOSIS — Z720 Tobacco use: Secondary | ICD-10-CM | POA: Diagnosis present

## 2022-10-12 DIAGNOSIS — R7303 Prediabetes: Secondary | ICD-10-CM | POA: Diagnosis present

## 2022-10-12 DIAGNOSIS — Z83438 Family history of other disorder of lipoprotein metabolism and other lipidemia: Secondary | ICD-10-CM

## 2022-10-12 DIAGNOSIS — F10939 Alcohol use, unspecified with withdrawal, unspecified: Secondary | ICD-10-CM | POA: Diagnosis present

## 2022-10-12 DIAGNOSIS — F1990 Other psychoactive substance use, unspecified, uncomplicated: Secondary | ICD-10-CM | POA: Diagnosis present

## 2022-10-12 DIAGNOSIS — D539 Nutritional anemia, unspecified: Secondary | ICD-10-CM | POA: Diagnosis present

## 2022-10-12 DIAGNOSIS — N390 Urinary tract infection, site not specified: Secondary | ICD-10-CM | POA: Diagnosis present

## 2022-10-12 DIAGNOSIS — Z5982 Transportation insecurity: Secondary | ICD-10-CM

## 2022-10-12 DIAGNOSIS — K7010 Alcoholic hepatitis without ascites: Secondary | ICD-10-CM | POA: Diagnosis present

## 2022-10-12 DIAGNOSIS — G8929 Other chronic pain: Secondary | ICD-10-CM | POA: Diagnosis present

## 2022-10-12 DIAGNOSIS — F17210 Nicotine dependence, cigarettes, uncomplicated: Secondary | ICD-10-CM | POA: Diagnosis present

## 2022-10-12 DIAGNOSIS — R34 Anuria and oliguria: Secondary | ICD-10-CM | POA: Diagnosis present

## 2022-10-12 DIAGNOSIS — F419 Anxiety disorder, unspecified: Secondary | ICD-10-CM | POA: Diagnosis present

## 2022-10-12 DIAGNOSIS — Z765 Malingerer [conscious simulation]: Secondary | ICD-10-CM

## 2022-10-12 DIAGNOSIS — Z886 Allergy status to analgesic agent status: Secondary | ICD-10-CM

## 2022-10-12 DIAGNOSIS — R112 Nausea with vomiting, unspecified: Secondary | ICD-10-CM | POA: Diagnosis present

## 2022-10-12 DIAGNOSIS — G4700 Insomnia, unspecified: Secondary | ICD-10-CM | POA: Diagnosis present

## 2022-10-12 DIAGNOSIS — F1410 Cocaine abuse, uncomplicated: Secondary | ICD-10-CM | POA: Diagnosis present

## 2022-10-12 DIAGNOSIS — F10920 Alcohol use, unspecified with intoxication, uncomplicated: Secondary | ICD-10-CM

## 2022-10-12 DIAGNOSIS — Z8249 Family history of ischemic heart disease and other diseases of the circulatory system: Secondary | ICD-10-CM

## 2022-10-12 DIAGNOSIS — E872 Acidosis, unspecified: Secondary | ICD-10-CM | POA: Diagnosis present

## 2022-10-12 DIAGNOSIS — D72818 Other decreased white blood cell count: Secondary | ICD-10-CM | POA: Diagnosis present

## 2022-10-12 DIAGNOSIS — Z6835 Body mass index (BMI) 35.0-35.9, adult: Secondary | ICD-10-CM

## 2022-10-12 DIAGNOSIS — Z5941 Food insecurity: Secondary | ICD-10-CM

## 2022-10-12 DIAGNOSIS — R7989 Other specified abnormal findings of blood chemistry: Secondary | ICD-10-CM | POA: Diagnosis present

## 2022-10-12 DIAGNOSIS — D649 Anemia, unspecified: Secondary | ICD-10-CM | POA: Diagnosis present

## 2022-10-12 DIAGNOSIS — E876 Hypokalemia: Secondary | ICD-10-CM | POA: Diagnosis present

## 2022-10-12 DIAGNOSIS — R739 Hyperglycemia, unspecified: Secondary | ICD-10-CM | POA: Diagnosis present

## 2022-10-12 DIAGNOSIS — R160 Hepatomegaly, not elsewhere classified: Secondary | ICD-10-CM

## 2022-10-12 DIAGNOSIS — E8809 Other disorders of plasma-protein metabolism, not elsewhere classified: Secondary | ICD-10-CM | POA: Diagnosis present

## 2022-10-12 DIAGNOSIS — Y908 Blood alcohol level of 240 mg/100 ml or more: Secondary | ICD-10-CM | POA: Diagnosis present

## 2022-10-12 DIAGNOSIS — E669 Obesity, unspecified: Secondary | ICD-10-CM | POA: Diagnosis present

## 2022-10-12 DIAGNOSIS — R45851 Suicidal ideations: Secondary | ICD-10-CM | POA: Diagnosis not present

## 2022-10-12 DIAGNOSIS — K861 Other chronic pancreatitis: Secondary | ICD-10-CM | POA: Diagnosis present

## 2022-10-12 DIAGNOSIS — F10230 Alcohol dependence with withdrawal, uncomplicated: Principal | ICD-10-CM | POA: Diagnosis present

## 2022-10-12 LAB — URINALYSIS, ROUTINE W REFLEX MICROSCOPIC
Bilirubin Urine: NEGATIVE
Glucose, UA: 100 mg/dL — AB
Hgb urine dipstick: NEGATIVE
Ketones, ur: NEGATIVE mg/dL
Leukocytes,Ua: NEGATIVE
Nitrite: NEGATIVE
Protein, ur: NEGATIVE mg/dL
Specific Gravity, Urine: <=1.005 (ref 1.005–1.030)
pH: 5 (ref 5.0–8.0)

## 2022-10-12 LAB — CBC WITH DIFFERENTIAL/PLATELET
Abs Immature Granulocytes: 0.08 10*3/uL — ABNORMAL HIGH (ref 0.00–0.07)
Basophils Absolute: 0 10*3/uL (ref 0.0–0.1)
Basophils Relative: 1 %
Eosinophils Absolute: 0 10*3/uL (ref 0.0–0.5)
Eosinophils Relative: 1 %
HCT: 32.1 % — ABNORMAL LOW (ref 36.0–46.0)
Hemoglobin: 11.2 g/dL — ABNORMAL LOW (ref 12.0–15.0)
Immature Granulocytes: 2 %
Lymphocytes Relative: 31 %
Lymphs Abs: 1.3 10*3/uL (ref 0.7–4.0)
MCH: 33.4 pg (ref 26.0–34.0)
MCHC: 34.9 g/dL (ref 30.0–36.0)
MCV: 95.8 fL (ref 80.0–100.0)
Monocytes Absolute: 0.4 10*3/uL (ref 0.1–1.0)
Monocytes Relative: 10 %
Neutro Abs: 2.3 10*3/uL (ref 1.7–7.7)
Neutrophils Relative %: 55 %
Platelets: 176 10*3/uL (ref 150–400)
RBC: 3.35 MIL/uL — ABNORMAL LOW (ref 3.87–5.11)
RDW: 18.6 % — ABNORMAL HIGH (ref 11.5–15.5)
WBC: 4.1 10*3/uL (ref 4.0–10.5)
nRBC: 0 % (ref 0.0–0.2)

## 2022-10-12 LAB — COMPREHENSIVE METABOLIC PANEL
ALT: 113 U/L — ABNORMAL HIGH (ref 0–44)
AST: 135 U/L — ABNORMAL HIGH (ref 15–41)
Albumin: 3.3 g/dL — ABNORMAL LOW (ref 3.5–5.0)
Alkaline Phosphatase: 109 U/L (ref 38–126)
Anion gap: 21 — ABNORMAL HIGH (ref 5–15)
BUN: <5 mg/dL — ABNORMAL LOW (ref 6–20)
CO2: 18 mmol/L — ABNORMAL LOW (ref 22–32)
Calcium: 8.5 mg/dL — ABNORMAL LOW (ref 8.9–10.3)
Chloride: 95 mmol/L — ABNORMAL LOW (ref 98–111)
Creatinine, Ser: 0.61 mg/dL (ref 0.44–1.00)
GFR, Estimated: >60 mL/min (ref >60)
Glucose, Bld: 235 mg/dL — ABNORMAL HIGH (ref 70–99)
Potassium: 3.3 mmol/L — ABNORMAL LOW (ref 3.5–5.1)
Sodium: 134 mmol/L — ABNORMAL LOW (ref 135–145)
Total Bilirubin: 0.8 mg/dL (ref 0.3–1.2)
Total Protein: 5.7 g/dL — ABNORMAL LOW (ref 6.5–8.1)

## 2022-10-12 LAB — I-STAT VENOUS BLOOD GAS, ED
Acid-base deficit: 2 mmol/L (ref 0.0–2.0)
Bicarbonate: 21.8 mmol/L (ref 20.0–28.0)
Calcium, Ion: 1.01 mmol/L — ABNORMAL LOW (ref 1.15–1.40)
HCT: 31 % — ABNORMAL LOW (ref 36.0–46.0)
Hemoglobin: 10.5 g/dL — ABNORMAL LOW (ref 12.0–15.0)
O2 Saturation: 98 %
Patient temperature: 98.2
Potassium: 2.8 mmol/L — ABNORMAL LOW (ref 3.5–5.1)
Sodium: 136 mmol/L (ref 135–145)
TCO2: 23 mmol/L (ref 22–32)
pCO2, Ven: 33 mmHg — ABNORMAL LOW (ref 44–60)
pH, Ven: 7.426 (ref 7.25–7.43)
pO2, Ven: 96 mmHg — ABNORMAL HIGH (ref 32–45)

## 2022-10-12 LAB — RAPID URINE DRUG SCREEN, HOSP PERFORMED
Amphetamines: NOT DETECTED
Barbiturates: NOT DETECTED
Benzodiazepines: NOT DETECTED
Cocaine: POSITIVE — AB
Opiates: NOT DETECTED
Tetrahydrocannabinol: NOT DETECTED

## 2022-10-12 LAB — LACTIC ACID, PLASMA
Lactic Acid, Venous: 8.1 mmol/L (ref 0.5–1.9)
Lactic Acid, Venous: 3.1 mmol/L (ref 0.5–1.9)

## 2022-10-12 LAB — BETA-HYDROXYBUTYRIC ACID: Beta-Hydroxybutyric Acid: 0.05 mmol/L (ref 0.05–0.27)

## 2022-10-12 LAB — ETHANOL: Alcohol, Ethyl (B): 277 mg/dL — ABNORMAL HIGH (ref <10)

## 2022-10-12 LAB — HCG, QUANTITATIVE, PREGNANCY: hCG, Beta Chain, Quant, S: <1 m[IU]/mL (ref <5)

## 2022-10-12 LAB — LIPASE, BLOOD: Lipase: 29 U/L (ref 11–51)

## 2022-10-12 MED ORDER — LORAZEPAM 1 MG PO TABS
1.0000 mg | ORAL_TABLET | ORAL | Status: DC | PRN
  Administered 2022-10-12 – 2022-10-13 (×3): 1 mg via ORAL
  Administered 2022-10-13 (×2): 2 mg via ORAL
  Administered 2022-10-13: 1 mg via ORAL
  Filled 2022-10-12 (×4): qty 1
  Filled 2022-10-12 (×2): qty 2

## 2022-10-12 MED ORDER — SODIUM CHLORIDE 0.9 % IV BOLUS
1000.0000 mL | Freq: Once | INTRAVENOUS | Status: AC
  Administered 2022-10-12: 1000 mL via INTRAVENOUS

## 2022-10-12 MED ORDER — ADULT MULTIVITAMIN W/MINERALS CH
1.0000 | ORAL_TABLET | Freq: Every day | ORAL | Status: DC
  Administered 2022-10-12 – 2022-10-20 (×9): 1 via ORAL
  Filled 2022-10-12 (×9): qty 1

## 2022-10-12 MED ORDER — PROCHLORPERAZINE EDISYLATE 10 MG/2ML IJ SOLN
10.0000 mg | Freq: Once | INTRAMUSCULAR | Status: AC
  Administered 2022-10-12: 10 mg via INTRAVENOUS
  Filled 2022-10-12: qty 2

## 2022-10-12 MED ORDER — SODIUM CHLORIDE 0.9 % IV SOLN
25.0000 mg | Freq: Once | INTRAVENOUS | Status: AC
  Administered 2022-10-12: 25 mg via INTRAVENOUS
  Filled 2022-10-12: qty 1

## 2022-10-12 MED ORDER — LORAZEPAM 2 MG/ML IJ SOLN: 1.0000 mg | INTRAMUSCULAR | Status: DC | PRN

## 2022-10-12 MED ORDER — THIAMINE MONONITRATE 100 MG PO TABS
100.0000 mg | ORAL_TABLET | Freq: Every day | ORAL | Status: DC
  Administered 2022-10-12 – 2022-10-15 (×4): 100 mg via ORAL
  Filled 2022-10-12 (×4): qty 1

## 2022-10-12 MED ORDER — LORAZEPAM 2 MG/ML IJ SOLN
1.0000 mg | Freq: Once | INTRAMUSCULAR | Status: AC
  Administered 2022-10-12: 1 mg via INTRAVENOUS
  Filled 2022-10-12: qty 1

## 2022-10-12 MED ORDER — THIAMINE HCL 100 MG/ML IJ SOLN: 100.0000 mg | Freq: Every day | INTRAMUSCULAR | Status: DC

## 2022-10-12 MED ORDER — PROMETHAZINE HCL 25 MG/ML IJ SOLN
INTRAMUSCULAR | Status: AC
  Filled 2022-10-12: qty 1

## 2022-10-12 MED ORDER — MORPHINE SULFATE (PF) 4 MG/ML IV SOLN
4.0000 mg | Freq: Once | INTRAVENOUS | Status: AC
  Administered 2022-10-12: 4 mg via INTRAVENOUS
  Filled 2022-10-12: qty 1

## 2022-10-12 MED ORDER — FOLIC ACID 1 MG PO TABS
1.0000 mg | ORAL_TABLET | Freq: Every day | ORAL | Status: DC
  Administered 2022-10-12 – 2022-10-20 (×9): 1 mg via ORAL
  Filled 2022-10-12 (×9): qty 1

## 2022-10-12 MED ORDER — DIPHENHYDRAMINE HCL 50 MG/ML IJ SOLN
50.0000 mg | Freq: Once | INTRAMUSCULAR | Status: AC
  Administered 2022-10-12: 50 mg via INTRAVENOUS
  Filled 2022-10-12: qty 1

## 2022-10-12 MED ORDER — PROMETHAZINE HCL 25 MG/ML IJ SOLN
INTRAMUSCULAR | Status: AC
  Administered 2022-10-12: 25 mg
  Filled 2022-10-12: qty 1

## 2022-10-12 MED ORDER — SODIUM CHLORIDE 0.9 % IV SOLN
12.5000 mg | Freq: Once | INTRAVENOUS | Status: AC
  Administered 2022-10-12: 12.5 mg via INTRAVENOUS
  Filled 2022-10-12: qty 0.5

## 2022-10-12 MED ORDER — IOHEXOL 300 MG/ML  SOLN
80.0000 mL | Freq: Once | INTRAMUSCULAR | Status: AC | PRN
  Administered 2022-10-12: 80 mL via INTRAVENOUS

## 2022-10-12 MED ORDER — NICOTINE 21 MG/24HR TD PT24
21.0000 mg | MEDICATED_PATCH | Freq: Once | TRANSDERMAL | Status: AC
  Administered 2022-10-12: 21 mg via TRANSDERMAL
  Filled 2022-10-12: qty 1

## 2022-10-12 MED ORDER — DROPERIDOL 2.5 MG/ML IJ SOLN
1.2500 mg | Freq: Once | INTRAMUSCULAR | Status: AC
  Administered 2022-10-12: 1.25 mg via INTRAVENOUS
  Filled 2022-10-12: qty 2

## 2022-10-12 NOTE — ED Notes (Signed)
Pt called requesting withdrawal meds , she also requested her purse .  This nurse witnessed patient using her E-cigarette despite direction to not use while in treatment room and that this is a tobacco free campus .  Charge Nurse made aware .

## 2022-10-12 NOTE — ED Notes (Addendum)
Patient stated "Can I have some benadryl?"  When asked why she responded "so I can sleep".  When told that is not what benadryl is for she responded "well then I have allergies". MD aware.

## 2022-10-12 NOTE — ED Notes (Signed)
Pt found to be tampering with PIV, disconnected herself from IV pump and all monitoring equipment. RN entered room to find pt rehooking IV to pump.  Pt educated that she is not to tamper with hospital equipment and the danger of tampering with PIV and risk for infection.

## 2022-10-12 NOTE — ED Notes (Signed)
Pt refused EKG.

## 2022-10-12 NOTE — ED Notes (Signed)
[facility] called for call back to [facility] MD for hospitalist consult for admit

## 2022-10-12 NOTE — ED Notes (Signed)
Date and time results received: 10/12/22 1357  Test: LActic Critical Value: 8.1  Name of Provider Notified: tegeler  Orders Received? Or Actions Taken?: Repeat Lactic

## 2022-10-12 NOTE — ED Notes (Signed)
Pt ambulatory to bathroom, standby assistance.

## 2022-10-12 NOTE — ED Notes (Signed)
Patient repeatedly removes blood pressure cuff, pulse O2 and cardiac monitor leads.  RN has told her why they need to stay on but patient still removes them.

## 2022-10-12 NOTE — ED Provider Notes (Signed)
Patient signed out to me at 1530 by Dr. Rush Landmark pending CT read and repeat lactate with plan for admission.  In short this is a 32 year old female with a past medical history of alcohol use disorder presenting to the emergency department for nausea and vomiting.  Patient was initially evaluated by the day team and was found to have lactic acidosis mild hypokalemia.  Transaminitis is stable.  The patient was given IV fluids and symptomatic management with Compazine, Benadryl, Ativan and Phenergan.  The patient was complaining of sided flank pain and additionally had CT performed.  Urine was negative for UTI.  CT showed hepatomegaly but no acute abnormalities.  The patient was given additional morphine and droperidol for her pain and her nausea.  Repeat lactate is pending at this time.  On my evaluation, the patient was pacing around the room complaining of pain and nausea.  Her abdomen is soft and nontender, no CVA tenderness.  Patient is agreeable to stay in the stretcher to receive repeat dose of medication.  She will additionally be placed on the CIWA protocol.  EtOH level was 277 on arrival.  Clinical Course as of 10/12/22 1949  Fri Oct 12, 2022  1610 Repeat lactic is downtrending. Hospitalist re-called for admission [VK]    Clinical Course User Index [VK] Rexford Maus, DO      Rexford Maus, [place] 10/12/22 1949

## 2022-10-12 NOTE — ED Notes (Signed)
Walked in room to assess my patient , found actively vomiting on bed while laying on her left side . Also incontinent of urine . Alert and oriented x 4 , changed bed linen and changed to clean gown . Medicated and VS checked .
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
## 2022-10-12 NOTE — ED Notes (Signed)
Patient stated she wants to leave.  When told that she would have to leave against medical advice she responded with "Well I just want to feel better."  When asked what she will do at home to feel better the patient stated "I will get some alcohol".  Patient educated on how that will only help her 'feel better' in the short term.

## 2022-10-12 NOTE — ED Notes (Signed)
Date and time results received: 10/12/22 1732 (use smartphrase ".now" to insert current time)  Test: lactic Critical Value: 3.1  Name of Provider Notified: Theresia Lo  Orders Received? Or Actions Taken?: none

## 2022-10-12 NOTE — Progress Notes (Signed)
TOC consulted for substance abuse resources. Resources provided and attached to AVS. No further TOC needs.  

## 2022-10-12 NOTE — ED Triage Notes (Signed)
Pt states here for alcohol withdraw. Last drink in the last hour. Seen at [facility] yesterday with plan for outpatient. Wants inpatient.

## 2022-10-12 NOTE — ED Notes (Signed)
Pt continuously calling out, to doorway naked, demanding to speak with doctor, reporting that she is going outside, demanding to leave. EDP to bedside to talk to pt.

## 2022-10-12 NOTE — ED Notes (Signed)
Pt unable to provide urine sample. Aware the specimen is needed.

## 2022-10-12 NOTE — ED Notes (Signed)
Pt transported to imaging.

## 2022-10-13 DIAGNOSIS — E876 Hypokalemia: Secondary | ICD-10-CM | POA: Diagnosis present

## 2022-10-13 DIAGNOSIS — K7010 Alcoholic hepatitis without ascites: Secondary | ICD-10-CM | POA: Diagnosis present

## 2022-10-13 DIAGNOSIS — D72819 Decreased white blood cell count, unspecified: Secondary | ICD-10-CM | POA: Diagnosis present

## 2022-10-13 DIAGNOSIS — R7989 Other specified abnormal findings of blood chemistry: Secondary | ICD-10-CM | POA: Diagnosis present

## 2022-10-13 DIAGNOSIS — R7303 Prediabetes: Secondary | ICD-10-CM | POA: Diagnosis present

## 2022-10-13 DIAGNOSIS — D649 Anemia, unspecified: Secondary | ICD-10-CM | POA: Diagnosis present

## 2022-10-13 DIAGNOSIS — E872 Acidosis, unspecified: Secondary | ICD-10-CM | POA: Diagnosis present

## 2022-10-13 LAB — AMMONIA: Ammonia: 33 umol/L (ref 9–35)

## 2022-10-13 LAB — COMPREHENSIVE METABOLIC PANEL
ALT: 139 U/L — ABNORMAL HIGH (ref 0–44)
AST: 383 U/L — ABNORMAL HIGH (ref 15–41)
Albumin: 2.7 g/dL — ABNORMAL LOW (ref 3.5–5.0)
Alkaline Phosphatase: 122 U/L (ref 38–126)
Anion gap: 17 — ABNORMAL HIGH (ref 5–15)
BUN: <5 mg/dL — ABNORMAL LOW (ref 6–20)
CO2: 21 mmol/L — ABNORMAL LOW (ref 22–32)
Calcium: 7.7 mg/dL — ABNORMAL LOW (ref 8.9–10.3)
Chloride: 95 mmol/L — ABNORMAL LOW (ref 98–111)
Creatinine, Ser: 0.51 mg/dL (ref 0.44–1.00)
GFR, Estimated: >60 mL/min (ref >60)
Glucose, Bld: 159 mg/dL — ABNORMAL HIGH (ref 70–99)
Potassium: 2.9 mmol/L — ABNORMAL LOW (ref 3.5–5.1)
Sodium: 133 mmol/L — ABNORMAL LOW (ref 135–145)
Total Bilirubin: 2.1 mg/dL — ABNORMAL HIGH (ref 0.3–1.2)
Total Protein: 5.5 g/dL — ABNORMAL LOW (ref 6.5–8.1)

## 2022-10-13 LAB — GLUCOSE, CAPILLARY
Glucose-Capillary: 176 mg/dL — ABNORMAL HIGH (ref 70–99)
Glucose-Capillary: 175 mg/dL — ABNORMAL HIGH (ref 70–99)

## 2022-10-13 LAB — CBC
HCT: 31.4 % — ABNORMAL LOW (ref 36.0–46.0)
Hemoglobin: 10.6 g/dL — ABNORMAL LOW (ref 12.0–15.0)
MCH: 33.3 pg (ref 26.0–34.0)
MCHC: 33.8 g/dL (ref 30.0–36.0)
MCV: 98.7 fL (ref 80.0–100.0)
Platelets: 163 10*3/uL (ref 150–400)
RBC: 3.18 MIL/uL — ABNORMAL LOW (ref 3.87–5.11)
RDW: 19.8 % — ABNORMAL HIGH (ref 11.5–15.5)
WBC: 3.6 10*3/uL — ABNORMAL LOW (ref 4.0–10.5)
nRBC: 0.5 % — ABNORMAL HIGH (ref 0.0–0.2)

## 2022-10-13 LAB — MAGNESIUM: Magnesium: 1.4 mg/dL — ABNORMAL LOW (ref 1.7–2.4)

## 2022-10-13 LAB — LACTIC ACID, PLASMA
Lactic Acid, Venous: 4.7 mmol/L (ref 0.5–1.9)
Lactic Acid, Venous: 3.9 mmol/L (ref 0.5–1.9)

## 2022-10-13 LAB — HIV ANTIBODY (ROUTINE TESTING W REFLEX): HIV Screen 4th Generation wRfx: NONREACTIVE

## 2022-10-13 LAB — PHOSPHORUS: Phosphorus: 1.4 mg/dL — ABNORMAL LOW (ref 2.5–4.6)

## 2022-10-13 LAB — MRSA NEXT GEN BY PCR, NASAL: MRSA by PCR Next Gen: NOT DETECTED

## 2022-10-13 MED ORDER — POTASSIUM CHLORIDE IN NACL 40-0.9 MEQ/L-% IV SOLN
INTRAVENOUS | Status: AC
  Filled 2022-10-13: qty 1000

## 2022-10-13 MED ORDER — ACETAMINOPHEN 650 MG RE SUPP: 650.0000 mg | Freq: Four times a day (QID) | RECTAL | Status: DC | PRN

## 2022-10-13 MED ORDER — LORAZEPAM 1 MG PO TABS: 0.0000 mg | ORAL_TABLET | Freq: Three times a day (TID) | ORAL | Status: DC

## 2022-10-13 MED ORDER — LORAZEPAM 1 MG PO TABS
0.0000 mg | ORAL_TABLET | ORAL | Status: DC
  Administered 2022-10-13 (×2): 2 mg via ORAL
  Administered 2022-10-14: 3 mg via ORAL
  Filled 2022-10-13: qty 2
  Filled 2022-10-13: qty 3
  Filled 2022-10-13 (×2): qty 2

## 2022-10-13 MED ORDER — ACETAMINOPHEN 325 MG PO TABS
650.0000 mg | ORAL_TABLET | Freq: Once | ORAL | Status: AC
  Administered 2022-10-13: 650 mg via ORAL
  Filled 2022-10-13: qty 2

## 2022-10-13 MED ORDER — LACTATED RINGERS IV BOLUS
1000.0000 mL | Freq: Once | INTRAVENOUS | Status: AC
  Administered 2022-10-13: 1000 mL via INTRAVENOUS

## 2022-10-13 MED ORDER — ALBUTEROL SULFATE (2.5 MG/3ML) 0.083% IN NEBU: 2.5000 mg | INHALATION_SOLUTION | RESPIRATORY_TRACT | Status: DC | PRN

## 2022-10-13 MED ORDER — ONDANSETRON HCL 4 MG/2ML IJ SOLN
4.0000 mg | Freq: Four times a day (QID) | INTRAMUSCULAR | Status: DC | PRN
  Administered 2022-10-14 – 2022-10-18 (×8): 4 mg via INTRAVENOUS
  Filled 2022-10-13 (×8): qty 2

## 2022-10-13 MED ORDER — LORAZEPAM 1 MG PO TABS
1.0000 mg | ORAL_TABLET | ORAL | Status: DC | PRN
  Administered 2022-10-13 (×2): 2 mg via ORAL
  Administered 2022-10-14 (×2): 3 mg via ORAL
  Administered 2022-10-14: 2 mg via ORAL
  Administered 2022-10-14: 1 mg via ORAL
  Filled 2022-10-13: qty 3
  Filled 2022-10-13: qty 2
  Filled 2022-10-13: qty 1
  Filled 2022-10-13: qty 3
  Filled 2022-10-13: qty 2

## 2022-10-13 MED ORDER — LORAZEPAM 1 MG PO TABS
1.0000 mg | ORAL_TABLET | ORAL | Status: DC | PRN
  Filled 2022-10-13: qty 2

## 2022-10-13 MED ORDER — ACETAMINOPHEN 500 MG PO TABS: 1000.0000 mg | ORAL_TABLET | Freq: Once | ORAL | Status: DC

## 2022-10-13 MED ORDER — CHLORHEXIDINE GLUCONATE CLOTH 2 % EX PADS
6.0000 | MEDICATED_PAD | Freq: Every day | CUTANEOUS | Status: DC
  Administered 2022-10-13 – 2022-10-19 (×6): 6 via TOPICAL

## 2022-10-13 MED ORDER — POTASSIUM CHLORIDE CRYS ER 20 MEQ PO TBCR
20.0000 meq | EXTENDED_RELEASE_TABLET | Freq: Once | ORAL | Status: AC
  Administered 2022-10-13: 20 meq via ORAL
  Filled 2022-10-13: qty 1

## 2022-10-13 MED ORDER — ORAL CARE MOUTH RINSE: 15.0000 mL | OROMUCOSAL | Status: DC | PRN

## 2022-10-13 MED ORDER — ACETAMINOPHEN 325 MG PO TABS
650.0000 mg | ORAL_TABLET | Freq: Four times a day (QID) | ORAL | Status: DC | PRN
  Administered 2022-10-13 – 2022-10-20 (×6): 650 mg via ORAL
  Filled 2022-10-13 (×6): qty 2

## 2022-10-13 MED ORDER — LACTATED RINGERS IV SOLN: INTRAVENOUS | Status: DC

## 2022-10-13 MED ORDER — MAGNESIUM SULFATE 2 GM/50ML IV SOLN
2.0000 g | Freq: Once | INTRAVENOUS | Status: AC
  Administered 2022-10-13: 2 g via INTRAVENOUS
  Filled 2022-10-13: qty 50

## 2022-10-13 MED ORDER — POTASSIUM PHOSPHATES 15 MMOLE/5ML IV SOLN
45.0000 mmol | Freq: Once | INTRAVENOUS | Status: AC
  Administered 2022-10-13: 45 mmol via INTRAVENOUS
  Filled 2022-10-13: qty 15

## 2022-10-13 MED ORDER — LORAZEPAM 2 MG/ML IJ SOLN: 2.0000 mg | Freq: Once | INTRAMUSCULAR | Status: DC

## 2022-10-13 MED ORDER — ONDANSETRON HCL 4 MG PO TABS
4.0000 mg | ORAL_TABLET | Freq: Four times a day (QID) | ORAL | Status: DC | PRN
  Administered 2022-10-17 – 2022-10-19 (×2): 4 mg via ORAL
  Filled 2022-10-13 (×2): qty 1

## 2022-10-13 NOTE — ED Notes (Signed)
Patient is resting comfortably. 

## 2022-10-13 NOTE — ED Notes (Signed)
[facility] called for transfer 

## 2022-10-13 NOTE — ED Notes (Signed)
Pt was vaping in room just before shift change. Night shift gave vaping device to security

## 2022-10-13 NOTE — ED Notes (Signed)
[facility] arrived  

## 2022-10-13 NOTE — H&P (Signed)
History and Physical    Patient: Heather Shepherd ZOX:[id number] DOB: [birth year] DOA: 10/12/2022 DOS: the patient was seen and examined on 10/13/2022 PCP: Pcp, No  Patient coming from: Home  Chief Complaint:  Chief Complaint  Patient presents with   Alcohol Intoxication   HPI: Heather Shepherd is a 32 y.o. female with medical history significant of anxiety, depression, palpitations, dysmenorrhea, opioid dependence on agonist therapy, polysubstance abuse, alcohol abuse, tobacco abuse who presented to the emergency department with alcohol intoxication and stating she was having alcohol withdrawal. She also has had abdominal pain, nausea, emesis, diarrhea for several days.  No constipation, melena or hematochezia.has been feeling fatigued, but denied fever, chills, rhinorrhea, sore throat, wheezing or hemoptysis.  No chest pain, palpitations, diaphoresis, PND, orthopnea or pitting edema of the lower extremities.  No flank pain, dysuria, frequency or hematuria.  No polyuria, polydipsia, polyphagia or blurred vision.  She has been seen at multiple facilities recently, but signed AMA due to family care issues.  She drinks beer and 1/5 of vodka every day.  She still uses snorted fentanyl  Lab work: Urinalysis with glucosuria of 100 mg/dL, but otherwise unremarkable.  UDS is positive for cocaine.  Her CBC showed a white count of 4.1, hemoglobin 11.2 g/dL platelets 811.  I-STAT venous blood gas with normal pH, pCO2 of 33.0 pO2 of 96 mmHg.  CMP with a sodium 134, potassium 3.3, chloride 95 and CO2 18 mmol/L with an anion gap of 21.  Glucose 235, creatinine 0.61, BUN 5 mg/dL.  Total protein 5.7 and albumin 3.3 g/dL.  AST 835 and ALT 113 units/L.  Normal total bilirubin, alkaline phosphatase and calcium after correction.  Alcohol level was 277.  Beta hydroxybutyric acid 0.05 mmol/L.  Lipase was normal.  Lactic acid 8.1 then 3.1 mmol/L.  Imaging: CT head without contrast showed disconjugate gaze but otherwise  unremarkable head CT.  CT abdomen/pelvis with contrast showing hepatomegaly with, with diffuse hepatic asteatosis.  Sequela of chronic calcific pancreatitis.  No acute inflammatory change.  No acute intra-abdominal or intrapelvic process.   ED course: Initial vital signs were temperature 98.2 F, pulse 76, respiration 18, BP 106/70 mmHg O2 sat 100% on room air.  The patient received NS 3000 mL bolus, Phenergan 12.5 mg IVP, morphine 4 mg IVP, lorazepam 1 mg IVP followed by lorazepam by CIWA protocol, droperidol 2.5 mg IVP and acetaminophen 650 mg p.o. x 1.  Review of Systems: As mentioned in the history of present illness. All other systems reviewed and are negative. Past Medical History:  Diagnosis Date   Anxiety and depression 11/08/2011   Dysmenorrhea    Opioid dependence on agonist therapy (HCC)    Palpitations 01/22/2012   Polysubstance abuse (HCC)    Preventative health care 11/11/2011   Tobacco abuse 11/11/2011   History reviewed. No pertinent surgical history. Social History:  reports that she has been smoking. She has a 2 pack-year smoking history. She has never used smokeless tobacco. She reports current alcohol use. She reports that she does not use drugs.  Allergies  Allergen Reactions   Ibuprofen Hives    Family History  Problem Relation Age of Onset   Hyperlipidemia Mother    Hyperlipidemia Father    Diabetes Maternal Grandmother    Heart disease Maternal Grandfather        triple bypass   Hyperlipidemia Maternal Grandfather    Hypertension Maternal Grandfather    Heart attack Maternal Grandfather     Prior to Admission medications  Not on File    Physical Exam: Vitals:   10/13/22 0600 10/13/22 0718 10/13/22 0805 10/13/22 0901  BP: (!) 132/95 136/70 (!) 138/93 125/69  Pulse: (!) 118 (!) 121 (!) 110 (!) 105  Resp: 17 16 16 19   Temp: 98.8 F (37.1 C) 98.7 F (37.1 C)  98.3 F (36.8 C)  TempSrc: Oral Oral    SpO2: 99% 100% 98% 100%  Weight:      Height:        Physical Exam Vitals and nursing note reviewed.  Constitutional:      General: She is awake. She is not in acute distress. HENT:     Head: Normocephalic.     Nose: No rhinorrhea.     Mouth/Throat:     Mouth: Mucous membranes are dry.  Eyes:     General: No scleral icterus.    Pupils: Pupils are equal, round, and reactive to light.  Neck:     Vascular: No JVD.  Cardiovascular:     Rate and Rhythm: Normal rate and regular rhythm.     Heart sounds: S1 normal and S2 normal.  Pulmonary:     Effort: Pulmonary effort is normal.     Breath sounds: Normal breath sounds. No wheezing, rhonchi or rales.  Abdominal:     General: Bowel sounds are normal.     Palpations: Abdomen is soft.     Tenderness: There is abdominal tenderness in the epigastric area. There is no guarding or rebound.  Musculoskeletal:     Cervical back: Neck supple.     Right lower leg: No edema.     Left lower leg: No edema.  Skin:    General: Skin is warm and dry.  Neurological:     General: No focal deficit present.     Mental Status: She is alert and oriented to person, place, and time.  Psychiatric:        Mood and Affect: Mood normal.        Behavior: Behavior normal. Behavior is cooperative.   Data Reviewed:  Results are pending, will review when available.  Assessment and Plan: Principal Problem:   Abdominal pain Associated with:   Nausea and vomiting Observation/SDU. Continue IV fluids. Will try diet later today. Analgesics as needed. Antiemetics as needed. Pantoprazole 40 mg IVP daily. Follow CBC, CMP in AM.  Active Problems:   Lactic acidosis In the setting of:   Acute alcoholic hepatitis Likely due to decreased clearance. Continue IV fluids. Follow lactic acid level in the morning.    Tobacco abuse Tobacco cessation. Nicotine replacement therapy as needed.    Alcohol withdrawal (HCC) CIWA protocol with lorazepam. Magnesium sulfate supplementation. Folate, MVI and  thiamine. Consult TOC team.    Hypophosphatemia Replacing. Follow level as needed.    Pseudohyponatremia Follow-up sodium and glucose level.    Prediabetes Carbohydrate modified diet. CBG monitoring  AC/HS Begin RI SS as needed. Check hemoglobin A1c.    Hypomagnesemia Despite earlier supplementation. Magnesium sulfate 4 g total given. Follow-up magnesium level in AM.    Hypokalemia Replacing.    Leukopenia   Normocytic anemia In the setting of alcoholic liver disease. Follow-up CBC in the morning.      Advance Care Planning:   Code Status: Full Code   Consults:   Family Communication:   Severity of Illness: The appropriate patient status for this patient is OBSERVATION. Observation status is judged to be reasonable and necessary in order to provide the required intensity of service  to ensure the patient's safety. The patient's presenting symptoms, physical exam findings, and initial radiographic and laboratory data in the context of their medical condition is felt to place them at decreased risk for further clinical deterioration. Furthermore, it is anticipated that the patient will be medically stable for discharge from the hospital within 2 midnights of admission.   Author: Bobette Mo, MD 10/13/2022 9:05 AM  For on call review [email or web address].   This document was prepared using Dragon voice recognition software and may contain some unintended transcription errors.

## 2022-10-13 NOTE — ED Notes (Signed)
Gave pt back vaping device. Informed pt she will need to have it locked in [facility] security once she arrives

## 2022-10-14 DIAGNOSIS — F10930 Alcohol use, unspecified with withdrawal, uncomplicated: Secondary | ICD-10-CM

## 2022-10-14 DIAGNOSIS — F1990 Other psychoactive substance use, unspecified, uncomplicated: Secondary | ICD-10-CM

## 2022-10-14 DIAGNOSIS — F10931 Alcohol use, unspecified with withdrawal delirium: Secondary | ICD-10-CM

## 2022-10-14 DIAGNOSIS — R112 Nausea with vomiting, unspecified: Secondary | ICD-10-CM

## 2022-10-14 LAB — CBC WITH DIFFERENTIAL/PLATELET
Abs Immature Granulocytes: 0.06 10*3/uL (ref 0.00–0.07)
Basophils Absolute: 0 10*3/uL (ref 0.0–0.1)
Basophils Relative: 0 %
Eosinophils Absolute: 0 10*3/uL (ref 0.0–0.5)
Eosinophils Relative: 1 %
HCT: 29.9 % — ABNORMAL LOW (ref 36.0–46.0)
Hemoglobin: 10.3 g/dL — ABNORMAL LOW (ref 12.0–15.0)
Immature Granulocytes: 2 %
Lymphocytes Relative: 24 %
Lymphs Abs: 0.7 10*3/uL (ref 0.7–4.0)
MCH: 33.1 pg (ref 26.0–34.0)
MCHC: 34.4 g/dL (ref 30.0–36.0)
MCV: 96.1 fL (ref 80.0–100.0)
Monocytes Absolute: 0.2 10*3/uL (ref 0.1–1.0)
Monocytes Relative: 8 %
Neutro Abs: 1.9 10*3/uL (ref 1.7–7.7)
Neutrophils Relative %: 65 %
Platelets: 228 10*3/uL (ref 150–400)
RBC: 3.11 MIL/uL — ABNORMAL LOW (ref 3.87–5.11)
RDW: 19.8 % — ABNORMAL HIGH (ref 11.5–15.5)
WBC: 2.9 10*3/uL — ABNORMAL LOW (ref 4.0–10.5)
nRBC: 1 % — ABNORMAL HIGH (ref 0.0–0.2)

## 2022-10-14 LAB — COMPREHENSIVE METABOLIC PANEL
ALT: 127 U/L — ABNORMAL HIGH (ref 0–44)
AST: 248 U/L — ABNORMAL HIGH (ref 15–41)
Albumin: 2.7 g/dL — ABNORMAL LOW (ref 3.5–5.0)
Alkaline Phosphatase: 136 U/L — ABNORMAL HIGH (ref 38–126)
Anion gap: 12 (ref 5–15)
BUN: <5 mg/dL — ABNORMAL LOW (ref 6–20)
CO2: 23 mmol/L (ref 22–32)
Calcium: 7.9 mg/dL — ABNORMAL LOW (ref 8.9–10.3)
Chloride: 101 mmol/L (ref 98–111)
Creatinine, Ser: 0.62 mg/dL (ref 0.44–1.00)
GFR, Estimated: >60 mL/min (ref >60)
Glucose, Bld: 154 mg/dL — ABNORMAL HIGH (ref 70–99)
Potassium: 3.4 mmol/L — ABNORMAL LOW (ref 3.5–5.1)
Sodium: 136 mmol/L (ref 135–145)
Total Bilirubin: 1.9 mg/dL — ABNORMAL HIGH (ref 0.3–1.2)
Total Protein: 5.4 g/dL — ABNORMAL LOW (ref 6.5–8.1)

## 2022-10-14 LAB — MAGNESIUM: Magnesium: 2 mg/dL (ref 1.7–2.4)

## 2022-10-14 LAB — GLUCOSE, CAPILLARY
Glucose-Capillary: 167 mg/dL — ABNORMAL HIGH (ref 70–99)
Glucose-Capillary: 154 mg/dL — ABNORMAL HIGH (ref 70–99)
Glucose-Capillary: 147 mg/dL — ABNORMAL HIGH (ref 70–99)
Glucose-Capillary: 113 mg/dL — ABNORMAL HIGH (ref 70–99)

## 2022-10-14 LAB — LACTIC ACID, PLASMA: Lactic Acid, Venous: 3.2 mmol/L (ref 0.5–1.9)

## 2022-10-14 MED ORDER — PHENOBARBITAL 32.4 MG PO TABS: 32.4000 mg | ORAL_TABLET | Freq: Three times a day (TID) | ORAL | Status: DC

## 2022-10-14 MED ORDER — LORAZEPAM 1 MG PO TABS
0.0000 mg | ORAL_TABLET | ORAL | Status: DC
  Administered 2022-10-14: 1 mg via ORAL
  Administered 2022-10-15: 4 mg via ORAL
  Filled 2022-10-14: qty 2
  Filled 2022-10-14: qty 4
  Filled 2022-10-14: qty 2

## 2022-10-14 MED ORDER — NICOTINE 21 MG/24HR TD PT24
21.0000 mg | MEDICATED_PATCH | Freq: Every day | TRANSDERMAL | Status: DC
  Administered 2022-10-14 – 2022-10-19 (×6): 21 mg via TRANSDERMAL
  Filled 2022-10-14 (×7): qty 1

## 2022-10-14 MED ORDER — PANTOPRAZOLE SODIUM 40 MG IV SOLR
40.0000 mg | Freq: Two times a day (BID) | INTRAVENOUS | Status: DC
  Administered 2022-10-14 – 2022-10-16 (×6): 40 mg via INTRAVENOUS
  Filled 2022-10-14 (×7): qty 10

## 2022-10-14 MED ORDER — LORAZEPAM 1 MG PO TABS: 0.0000 mg | ORAL_TABLET | Freq: Three times a day (TID) | ORAL | Status: DC

## 2022-10-14 MED ORDER — LORAZEPAM 2 MG/ML IJ SOLN
1.0000 mg | INTRAMUSCULAR | Status: DC | PRN
  Administered 2022-10-14: 1 mg via INTRAVENOUS
  Filled 2022-10-14: qty 1

## 2022-10-14 MED ORDER — PHENOBARBITAL 32.4 MG PO TABS
97.2000 mg | ORAL_TABLET | Freq: Three times a day (TID) | ORAL | Status: DC
  Administered 2022-10-14 – 2022-10-15 (×2): 97.2 mg via ORAL
  Filled 2022-10-14 (×2): qty 3

## 2022-10-14 MED ORDER — SODIUM CHLORIDE 0.9 % IV SOLN
260.0000 mg | Freq: Once | INTRAVENOUS | Status: AC
  Administered 2022-10-14: 260 mg via INTRAVENOUS
  Filled 2022-10-14: qty 2

## 2022-10-14 MED ORDER — PHENOBARBITAL 32.4 MG PO TABS: 97.2000 mg | ORAL_TABLET | Freq: Three times a day (TID) | ORAL | Status: DC

## 2022-10-14 MED ORDER — PHENOBARBITAL 32.4 MG PO TABS: 64.8000 mg | ORAL_TABLET | Freq: Three times a day (TID) | ORAL | Status: DC

## 2022-10-14 MED ORDER — POTASSIUM CHLORIDE CRYS ER 20 MEQ PO TBCR
40.0000 meq | EXTENDED_RELEASE_TABLET | Freq: Once | ORAL | Status: AC
  Administered 2022-10-14: 40 meq via ORAL
  Filled 2022-10-14: qty 2

## 2022-10-14 MED ORDER — HEPARIN SODIUM (PORCINE) 5000 UNIT/ML IJ SOLN
5000.0000 [IU] | Freq: Three times a day (TID) | INTRAMUSCULAR | Status: DC
  Administered 2022-10-14 – 2022-10-20 (×17): 5000 [IU] via SUBCUTANEOUS
  Filled 2022-10-14 (×18): qty 1

## 2022-10-14 MED ORDER — NALOXONE HCL 0.4 MG/ML IJ SOLN: 0.4000 mg | INTRAMUSCULAR | Status: DC | PRN

## 2022-10-14 MED ORDER — NICOTINE 14 MG/24HR TD PT24: 14.0000 mg | MEDICATED_PATCH | Freq: Every day | TRANSDERMAL | Status: DC

## 2022-10-14 MED ORDER — LORAZEPAM 2 MG/ML IJ SOLN
2.0000 mg | Freq: Once | INTRAMUSCULAR | Status: AC
  Filled 2022-10-14: qty 1

## 2022-10-14 MED ORDER — MELATONIN 5 MG PO TABS
5.0000 mg | ORAL_TABLET | Freq: Every evening | ORAL | Status: DC | PRN
  Administered 2022-10-14 (×2): 5 mg via ORAL
  Filled 2022-10-14 (×2): qty 1

## 2022-10-14 MED ORDER — LORAZEPAM 1 MG PO TABS: 1.0000 mg | ORAL_TABLET | ORAL | Status: DC | PRN

## 2022-10-14 MED ORDER — HALOPERIDOL LACTATE 5 MG/ML IJ SOLN
5.0000 mg | INTRAMUSCULAR | Status: DC | PRN
  Administered 2022-10-14 – 2022-10-20 (×12): 5 mg via INTRAVENOUS
  Filled 2022-10-14 (×12): qty 1

## 2022-10-14 MED ORDER — LORAZEPAM 2 MG/ML IJ SOLN
1.0000 mg | INTRAMUSCULAR | Status: DC | PRN
  Administered 2022-10-14 (×5): 4 mg via INTRAVENOUS
  Filled 2022-10-14: qty 1
  Filled 2022-10-14 (×4): qty 2

## 2022-10-14 MED ORDER — LORAZEPAM 2 MG/ML IJ SOLN
1.0000 mg | INTRAMUSCULAR | Status: DC | PRN
  Administered 2022-10-14: 4 mg via INTRAVENOUS
  Filled 2022-10-14: qty 2

## 2022-10-14 NOTE — Plan of Care (Signed)
  Problem: Health Behavior/Discharge Planning: Goal: Ability to manage health-related needs will improve Outcome: Not Progressing   Problem: Coping: Goal: Level of anxiety will decrease Outcome: Not Progressing   Problem: Elimination: Goal: Will not experience complications related to bowel motility Outcome: Not Progressing   Problem: Pain Managment: Goal: General experience of comfort will improve Outcome: Not Progressing

## 2022-10-14 NOTE — Consult Note (Signed)
NAME:  Heather Shepherd, MRN:  [id number], DOB:  1991-03-11, LOS: 1 ADMISSION DATE:  10/12/2022, CONSULTATION DATE:  10/14/22 REFERRING MD:  Glade Lloyd, MD CHIEF COMPLAINT:  Alcohol withdrawal, phenobarb  History of Present Illness:  32 year old female with polysubstance and alcohol abuse and anxiety who presents with alcohol intoxication and withdrawal. Admitted to [facility].  Paitnet has abdominal pain, nausea and vomiting. On admission, UDS +cocaine, EtOh 277, AST/ALT 135/113, LA 8.1. CT head NAICA. CT A/P with not acute abnormalities with hepatomegaly and diffuse hepatic steatosis, chronic calcified pancreatitis. Started on CIWA however remain agitated. PCCM consulted for phenobarb taper for alcohol withdrawal. Of note, she was recently at [facility] prior to this admission.  On my evaluation, patient was witnessed taking unknown substance 30 min prior to my arrival. Patient states she its "probably fentanyl but not sure." She is drowsy but can answer questions and follow some commands. Tachycardic but hemodynamically stable. Had already received phenobarb 260 mg prior to ingestion of substance.  Pertinent  Medical History  As above  Significant Hospital Events: Including procedures, antibiotic start and stop dates in addition to other pertinent events     Interim History / Subjective:  As above  Objective   Blood pressure (!) 143/100, pulse (!) 42, temperature 98.6 F (37 C), temperature source Axillary, resp. rate (!) 24, height 5\' 4"  (1.626 m), weight 93 kg, last menstrual period 09/28/2022, SpO2 (!) 87%.        Intake/Output Summary (Last 24 hours) at 10/14/2022 1251 Last data filed at 10/14/2022 0000 Gross per 24 hour  Intake 652.84 ml  Output --  Net 652.84 ml   Filed Weights   10/12/22 0638 10/13/22 1611  Weight: 90.7 kg 93 kg   Physical Exam: General: Chronically ill-appearing, mild encephalopathy, naked HENT: [place], AT, OP clear, MMM Eyes: Pupils 4mm and reactive, EOMI,  no scleral icterus Respiratory: Clear to auscultation bilaterally.  No crackles, wheezing or rales Cardiovascular: RRR, -M/R/G, no JVD GI: BS+, soft, nontender Extremities:-Edema,-tenderness Neuro: Drowsy, oriented x 3, CNII-XII grossly intact  Resolved Hospital Problem list     Assessment & Plan:   Acute encephalopathy 2/2: Alcohol withdrawal/intoxication Witnessed in-hospital drug use, suspected fentanyl -ICU/SDU monitoring with neuro checks q1 for now -Currently protecting airway -Narcan PRN for s/sx overdose -Hold sedating meds including phenobarb for now -Restart CIWA protocol with ativan if needed for withdrawal. Not needing anything at the present time -folic acid, thiamine IV, multivitamin   Abdominal pain/N/V -PPI -PRN Zofran -DC mIVF  Hypokalemia -Trend -Replete PRN  Polysubstance and alcohol abuse Tobacco abuse Recent SI -Psychiatry consulted however patient unable to participate -Nicotine patch  Best Practice (right click and "Reselect all SmartList Selections" daily)   Diet/type: NPO DVT prophylaxis: prophylactic heparin  GI prophylaxis: PPI Lines: N/A Foley:  N/A Code Status:  full code Last date of multidisciplinary goals of care discussion [per primary]  Labs   CBC: Recent Labs  Lab 10/12/22 0740 10/12/22 1012 10/13/22 1149 10/14/22 1100  WBC 4.1  --  3.6* 2.9*  NEUTROABS 2.3  --   --  1.9  HGB 11.2* 10.5* 10.6* 10.3*  HCT 32.1* 31.0* 31.4* 29.9*  MCV 95.8  --  98.7 96.1  PLT 176  --  163 228    Basic Metabolic Panel: Recent Labs  Lab 10/12/22 0740 10/12/22 1012 10/13/22 1149 10/14/22 1100  NA 134* 136 133* 136  K 3.3* 2.8* 2.9* 3.4*  CL 95*  --  95* 101  CO2 18*  --  21* 23  GLUCOSE 235*  --  159* 154*  BUN <5*  --  <5* <5*  CREATININE 0.61  --  0.51 0.62  CALCIUM 8.5*  --  7.7* 7.9*  MG  --   --  1.4* 2.0  PHOS  --   --  1.4*  --    GFR: Estimated Creatinine Clearance: 113.6 mL/min (by C-G formula based on SCr of 0.62  mg/dL). Recent Labs  Lab 10/12/22 0740 10/12/22 0757 10/12/22 1501 10/13/22 1149 10/13/22 1509 10/14/22 0250 10/14/22 1100  WBC 4.1  --   --  3.6*  --   --  2.9*  LATICACIDVEN  --    < > 3.1* 4.7* 3.9* 3.2*  --    < > = values in this interval not displayed.    Liver Function Tests: Recent Labs  Lab 10/12/22 0740 10/13/22 1149 10/14/22 1100  AST 135* 383* 248*  ALT 113* 139* 127*  ALKPHOS 109 122 136*  BILITOT 0.8 2.1* 1.9*  PROT 5.7* 5.5* 5.4*  ALBUMIN 3.3* 2.7* 2.7*   Recent Labs  Lab 10/12/22 0740  LIPASE 29   Recent Labs  Lab 10/13/22 1509  AMMONIA 33    ABG    Component Value Date/Time   HCO3 21.8 10/12/2022 1012   TCO2 23 10/12/2022 1012   ACIDBASEDEF 2.0 10/12/2022 1012   O2SAT 98 10/12/2022 1012     Coagulation Profile: No results for input(s): "INR", "PROTIME" in the last 168 hours.  Cardiac Enzymes: No results for input(s): "CKTOTAL", "CKMB", "CKMBINDEX", "TROPONINI" in the last 168 hours.  HbA1C: No results found for: "HGBA1C"  CBG: Recent Labs  Lab 10/13/22 1624 10/13/22 2123 10/14/22 0805 10/14/22 1149  GLUCAP 176* 175* 167* 154*    Review of Systems:   Unable to obtain due to AMS  Past Medical History:  She,  has a past medical history of Anxiety and depression (11/08/2011), Dysmenorrhea, Opioid dependence on agonist therapy (HCC), Palpitations (01/22/2012), Polysubstance abuse (HCC), Preventative health care (11/11/2011), and Tobacco abuse (11/11/2011).   Surgical History:  History reviewed. No pertinent surgical history.   Social History:   reports that she has been smoking. She has a 2 pack-year smoking history. She has never used smokeless tobacco. She reports current alcohol use. She reports that she does not use drugs.   Family History:  Her family history includes Diabetes in her maternal grandmother; Heart attack in her maternal grandfather; Heart disease in her maternal grandfather; Hyperlipidemia in her father,  maternal grandfather, and mother; Hypertension in her maternal grandfather.   Allergies Allergies  Allergen Reactions   Ibuprofen Hives     Home Medications  Prior to Admission medications   Not on File     Critical care time: 60 min    The patient is critically ill with multiple organ systems failure and requires high complexity decision making for assessment and support, frequent evaluation and titration of therapies, application of advanced monitoring technologies and extensive interpretation of multiple databases.   Mechele Collin, M.D. [facility] 10/14/2022 12:53 PM   Please see Amion for pager number to reach on-call Pulmonary and Critical Care Team.

## 2022-10-14 NOTE — Progress Notes (Signed)
PROGRESS NOTE    Heather Shepherd  ZHY:[id number] DOB: 1991-03-13 DOA: 10/12/2022 PCP: Pcp, No   Brief Narrative:  32 y.o. female with medical history significant of anxiety, depression, palpitations, dysmenorrhea, opioid dependence, polysubstance abuse, alcohol abuse, tobacco abuse presented with alcohol intoxication and withdrawal.  On presentation UDS was positive for cocaine; had elevated AST and ALT with normal lipase and total bilirubin.  CT of the head without contrast showed no acute intracranial abnormity.  CT of abdomen/pelvis with contrast showed hepatomegaly with diffuse hepatic steatosis and sequelae of chronic calcific pancreatitis.  She was started on CIWA protocol.  Assessment & Plan:   Alcohol withdrawal Alcohol intoxication history of alcohol abuse -Currently on CIWA protocol getting IV Ativan almost every hour as per nursing staff.  Also on thiamine, multivitamin and folic acid.  Still actively withdrawing.  Start phenobarb taper.  Consult PCCM: Follow recommendations -TOC consult.  Abdominal pain with nausea and vomiting -Probably from gastritis from above.  Will start Protonix 40 mg IV every 12 hours. -Antiemetics as needed.  IV fluids.  Advance diet as tolerated  Acute alcoholic hepatitis with elevated LFTs and total bilirubin -CT imaging as above.  Monitor LFTs.  Hypomagnesemia -Improved  Hypophosphatemia -no labs today  Hypokalemia -replace.  Repeat a.m. labs  Hyponatremia -Improved  Leukopenia -Questionable cause.  Monitor intermittently  Anemia of chronic disease -Possibly from chronic illnesses.  Hemoglobin stable.  Polysubstance abuse/tobacco abuse -Urine drug screen was positive for cocaine.  TOC consult.  Counseled regarding cessation/abstinence by admitting hospitalist  DVT prophylaxis: SCDs Code Status: Full Family Communication: none at bedside Disposition Plan: Status is: Inpatient Remains inpatient appropriate because: Of severity of  illness    Consultants: PCCM  Procedures: None Antimicrobials: None   Subjective: Patient seen and examined at bedside.  Poor historian.  Nursing staff reports that patient is getting Ativan every hour and is still actively withdrawing.  No seizures or vomiting reported.  Objective: Vitals:   10/14/22 0900 10/14/22 1000 10/14/22 1100 10/14/22 1152  BP:  (!) 143/100    Pulse: 88 (!) 42    Resp: (!) 26 12 (!) 24   Temp:    98.6 F (37 C)  TempSrc:    Axillary  SpO2: (!) 82% (!) 87%    Weight:      Height:        Intake/Output Summary (Last 24 hours) at 10/14/2022 1202 Last data filed at 10/14/2022 0000 Gross per 24 hour  Intake 652.84 ml  Output --  Net 652.84 ml   Filed Weights   10/12/22 0638 10/13/22 1611  Weight: 90.7 kg 93 kg    Examination:  General exam: Appears calm and comfortable.  Looks chronically ill and deconditioned.  On room air. Respiratory system: Bilateral decreased breath sounds at bases with scattered crackles and intermittent tachypnea Cardiovascular system: S1 & S2 heard, Rate controlled Gastrointestinal system: Abdomen is obese, nondistended, soft and nontender. Normal bowel sounds heard. Extremities: No cyanosis, clubbing, edema  Central nervous system: Awake, slow to respond, poor historian.  No focal neurological deficits. Moving extremities Skin: No rashes, lesions or ulcers Psychiatry: Looks anxious.  Not agitated.  Looks jittery.   Data Reviewed: I have personally reviewed following labs and imaging studies  CBC: Recent Labs  Lab 10/12/22 0740 10/12/22 1012 10/13/22 1149 10/14/22 1100  WBC 4.1  --  3.6* 2.9*  NEUTROABS 2.3  --   --  1.9  HGB 11.2* 10.5* 10.6* 10.3*  HCT 32.1* 31.0* 31.4* 29.9*  MCV 95.8  --  98.7 96.1  PLT 176  --  163 228   Basic Metabolic Panel: Recent Labs  Lab 10/12/22 0740 10/12/22 1012 10/13/22 1149 10/14/22 1100  NA 134* 136 133* 136  K 3.3* 2.8* 2.9* 3.4*  CL 95*  --  95* 101  CO2 18*  --   21* 23  GLUCOSE 235*  --  159* 154*  BUN <5*  --  <5* <5*  CREATININE 0.61  --  0.51 0.62  CALCIUM 8.5*  --  7.7* 7.9*  MG  --   --  1.4* 2.0  PHOS  --   --  1.4*  --    GFR: Estimated Creatinine Clearance: 113.6 mL/min (by C-G formula based on SCr of 0.62 mg/dL). Liver Function Tests: Recent Labs  Lab 10/12/22 0740 10/13/22 1149 10/14/22 1100  AST 135* 383* 248*  ALT 113* 139* 127*  ALKPHOS 109 122 136*  BILITOT 0.8 2.1* 1.9*  PROT 5.7* 5.5* 5.4*  ALBUMIN 3.3* 2.7* 2.7*   Recent Labs  Lab 10/12/22 0740  LIPASE 29   Recent Labs  Lab 10/13/22 1509  AMMONIA 33   Coagulation Profile: No results for input(s): "INR", "PROTIME" in the last 168 hours. Cardiac Enzymes: No results for input(s): "CKTOTAL", "CKMB", "CKMBINDEX", "TROPONINI" in the last 168 hours. BNP (last 3 results) No results for input(s): "PROBNP" in the last 8760 hours. HbA1C: No results for input(s): "HGBA1C" in the last 72 hours. CBG: Recent Labs  Lab 10/13/22 1624 10/13/22 2123 10/14/22 0805 10/14/22 1149  GLUCAP 176* 175* 167* 154*   Lipid Profile: No results for input(s): "CHOL", "HDL", "LDLCALC", "TRIG", "CHOLHDL", "LDLDIRECT" in the last 72 hours. Thyroid Function Tests: No results for input(s): "TSH", "T4TOTAL", "FREET4", "T3FREE", "THYROIDAB" in the last 72 hours. Anemia Panel: No results for input(s): "VITAMINB12", "FOLATE", "FERRITIN", "TIBC", "IRON", "RETICCTPCT" in the last 72 hours. Sepsis Labs: Recent Labs  Lab 10/12/22 1501 10/13/22 1149 10/13/22 1509 10/14/22 0250  LATICACIDVEN 3.1* 4.7* 3.9* 3.2*    Recent Results (from the past 240 hour(s))  MRSA Next Gen by PCR, Nasal     Status: None   Collection Time: 10/13/22  4:09 PM   Specimen: Nasal Mucosa; Nasal Swab  Result Value Ref Range Status   MRSA by PCR Next Gen NOT DETECTED NOT DETECTED Final    Comment: (NOTE) The GeneXpert MRSA Assay (FDA approved for NASAL specimens only), is one component of a comprehensive MRSA  colonization surveillance program. It is not intended to diagnose MRSA infection nor to guide or monitor treatment for MRSA infections. Test performance is not FDA approved in patients less than 55 years old. Performed at [facility], [address] [place], [place] [address]          Radiology Studies: CT ABDOMEN PELVIS W CONTRAST  Result Date: 10/12/2022 CLINICAL DATA:  Alcohol withdrawal, abdominal pain, nausea and vomiting, lactic acidosis, elevated LFTs EXAM: CT ABDOMEN AND PELVIS WITH CONTRAST TECHNIQUE: Multidetector CT imaging of the abdomen and pelvis was performed using the standard protocol following bolus administration of intravenous contrast. RADIATION DOSE REDUCTION: This exam was performed according to the departmental dose-optimization program which includes automated exposure control, adjustment of the mA and/or kV according to patient size and/or use of iterative reconstruction technique. CONTRAST:  80mL OMNIPAQUE IOHEXOL 300 MG/ML  SOLN COMPARISON:  02/21/2011 FINDINGS: Lower chest: No acute pleural or parenchymal lung disease. Hepatobiliary: Liver is markedly enlarged, with diffuse hepatic steatosis. No focal parenchymal liver  abnormality or biliary duct dilation. The gallbladder is unremarkable. Pancreas: Pancreatic parenchymal atrophy, with parenchymal calcifications consistent with sequela of chronic calcific pancreatitis. No acute inflammatory changes or pancreatic duct dilation. Spleen: Normal in size without focal abnormality. Adrenals/Urinary Tract: Adrenal glands are unremarkable. Kidneys are normal, without renal calculi, focal lesion, or hydronephrosis. Bladder is unremarkable. Stomach/Bowel: No bowel obstruction or ileus. Normal appendix right lower quadrant. No bowel wall thickening or inflammatory change. Vascular/Lymphatic: No significant vascular findings are present. No enlarged abdominal or pelvic lymph nodes. Reproductive: Uterus and  bilateral adnexa are unremarkable. Other: No free fluid or free intraperitoneal gas. Small fat containing umbilical hernia. No bowel herniation. Musculoskeletal: No acute or destructive bony abnormalities. Reconstructed images demonstrate no additional findings. IMPRESSION: 1. Hepatomegaly, with diffuse hepatic steatosis. 2. Sequela of chronic calcific pancreatitis. No acute inflammatory change. 3. No acute intra-abdominal or intrapelvic process. Electronically Signed   By: Sharlet Salina M.D.   On: 10/12/2022 15:47        Scheduled Meds:  Chlorhexidine Gluconate Cloth  6 each Topical Daily   folic acid  1 mg Oral Daily   multivitamin with minerals  1 tablet Oral Daily   nicotine  21 mg Transdermal Daily   phenobarbital  97.2 mg Oral Q8H   Followed by   Melene Muller ON 10/16/2022] phenobarbital  64.8 mg Oral Q8H   Followed by   Melene Muller ON 10/18/2022] phenobarbital  32.4 mg Oral Q8H   thiamine  100 mg Oral Daily   Or   thiamine  100 mg Intravenous Daily   Continuous Infusions:  lactated ringers 125 mL/hr at 10/14/22 0000   PHENObarbital            Glade Lloyd, MD [facility] 10/14/2022, 12:02 PM

## 2022-10-14 NOTE — Progress Notes (Signed)
Sitter alerted nurse about suspicious behavior. RN entered room and asked pt if we could remove her bra and put on a new gown. When the sitter and RN went to remove the bra, the patient reached in and took a pill before the nurse could remove it from the patients hand. RN asked the patient what the pill was. The patient responded that she didn't know what it was and that she "bought it off the street". MD notified.

## 2022-10-14 NOTE — Progress Notes (Signed)
Sitter found pill in patients room. RN identified pill as lorazepam 1mg . Witnessed waste with Julious Payer.

## 2022-10-14 NOTE — Progress Notes (Signed)
[facility], NP made aware

## 2022-10-14 NOTE — Progress Notes (Signed)
Patient expressed desire to "jump off of the building" when I asked her why she would want to do that she stated " I don't want to feel like this anymore, I just want to feel better" after I consoled the pt she began getting out of bed,pulling out her IV, disconnecting herself from the monitors and stating that she wanted to leave so that she could smoke her vape. She refused her nicotine patch. Patient was place in the bedside commode to pass BM and was safely returned to bed.

## 2022-10-14 NOTE — Progress Notes (Signed)
eLink Physician-Brief Progress Note Patient Name: Heather Shepherd DOB: [birth year] MRN: [id number]   Date of Service  10/14/2022  HPI/Events of Note  32 year old female with a history of polysubstance abuse who initially presented with alcohol withdrawal and concern that an unknown illicit substance in her hospital room earlier in the day.  Patient is quite agitated despite being restarted on phenobarbital taper oral and Ativan IV pushes per CIWA protocol.  She has poor safety awareness and a sitter is in the room.    eICU Interventions  Will add on Haldol every 4 hours as needed  If this is ineffective, may need one-time dose of phenobarbital IV.  Could consider Precedex if ineffective.     Intervention Category Intermediate Interventions: Change in mental status - evaluation and management  Heather Shepherd 10/14/2022, 10:46 PM

## 2022-10-14 NOTE — Consult Note (Signed)
Patient seen face to face in her hospital room for psychiatric consult. However, she was unable to participate in evaluation due to being lethargic and stuporous. She requested for the evaluation to be done another day due feeling physically sick. The hospitalist(Dr. Glade Lloyd) was made aware and said he will re-consult psych when patient is more alert and awake.  Plan- Psych consult service will follow patient tomorrow.  Thedore Mins, MD

## 2022-10-15 DIAGNOSIS — F10939 Alcohol use, unspecified with withdrawal, unspecified: Secondary | ICD-10-CM

## 2022-10-15 DIAGNOSIS — K7010 Alcoholic hepatitis without ascites: Secondary | ICD-10-CM

## 2022-10-15 LAB — HEMOGLOBIN A1C
Hgb A1c MFr Bld: 6.7 % — ABNORMAL HIGH (ref 4.8–5.6)
Mean Plasma Glucose: 146 mg/dL

## 2022-10-15 LAB — GLUCOSE, CAPILLARY
Glucose-Capillary: 125 mg/dL — ABNORMAL HIGH (ref 70–99)
Glucose-Capillary: 130 mg/dL — ABNORMAL HIGH (ref 70–99)
Glucose-Capillary: 124 mg/dL — ABNORMAL HIGH (ref 70–99)
Glucose-Capillary: 111 mg/dL — ABNORMAL HIGH (ref 70–99)
Glucose-Capillary: 118 mg/dL — ABNORMAL HIGH (ref 70–99)

## 2022-10-15 MED ORDER — PHENOBARBITAL SODIUM 130 MG/ML IJ SOLN
130.0000 mg | Freq: Two times a day (BID) | INTRAMUSCULAR | Status: AC
  Administered 2022-10-15 (×2): 130 mg via INTRAVENOUS
  Filled 2022-10-15 (×2): qty 1

## 2022-10-15 MED ORDER — DEXMEDETOMIDINE HCL IN NACL 200 MCG/50ML IV SOLN
0.0000 ug/kg/h | INTRAVENOUS | Status: DC
  Administered 2022-10-15 – 2022-10-16 (×4): 0.4 ug/kg/h via INTRAVENOUS
  Administered 2022-10-16: 0.6 ug/kg/h via INTRAVENOUS
  Administered 2022-10-16: 0.5 ug/kg/h via INTRAVENOUS
  Administered 2022-10-17: 0.6 ug/kg/h via INTRAVENOUS
  Administered 2022-10-17: 0.8 ug/kg/h via INTRAVENOUS
  Administered 2022-10-17: 1 ug/kg/h via INTRAVENOUS
  Administered 2022-10-17: 0.8 ug/kg/h via INTRAVENOUS
  Administered 2022-10-17: 0.7 ug/kg/h via INTRAVENOUS
  Administered 2022-10-17 (×2): 0.8 ug/kg/h via INTRAVENOUS
  Filled 2022-10-15 (×13): qty 50

## 2022-10-15 MED ORDER — PHENOBARBITAL SODIUM 130 MG/ML IJ SOLN
130.0000 mg | Freq: Once | INTRAMUSCULAR | Status: AC
  Administered 2022-10-15: 130 mg via INTRAVENOUS
  Filled 2022-10-15: qty 1

## 2022-10-15 MED ORDER — PHENOBARBITAL 32.4 MG PO TABS: 32.4000 mg | ORAL_TABLET | Freq: Every day | ORAL | Status: DC

## 2022-10-15 MED ORDER — LOPERAMIDE HCL 2 MG PO CAPS
2.0000 mg | ORAL_CAPSULE | ORAL | Status: DC | PRN
  Administered 2022-10-15 – 2022-10-17 (×4): 2 mg via ORAL
  Filled 2022-10-15 (×4): qty 1

## 2022-10-15 MED ORDER — PHENOBARBITAL 32.4 MG PO TABS
97.2000 mg | ORAL_TABLET | Freq: Two times a day (BID) | ORAL | Status: AC
  Administered 2022-10-16 (×2): 97.2 mg via ORAL
  Filled 2022-10-15 (×2): qty 3

## 2022-10-15 MED ORDER — PHENOBARBITAL 32.4 MG PO TABS: 32.4000 mg | ORAL_TABLET | Freq: Two times a day (BID) | ORAL | Status: DC

## 2022-10-15 NOTE — Progress Notes (Signed)
eLink Physician-Brief Progress Note Patient Name: Jearline Lapan DOB: [birth year] MRN: [id number]   Date of Service  10/15/2022  HPI/Events of Note  32 year old with polysubstance and alcohol abuse and anxiety who presents with alcohol intoxication and withdrawal.   eICU Interventions  Maintain phenobarbital taper.  24 hours of Precedex given the degree of agitation.   Will need IVC if she attempts to leave AMA.  Psychiatry consulted.     Intervention Category Minor Interventions: Agitation / anxiety - evaluation and management  Vaun Hyndman 10/15/2022, 9:28 PM

## 2022-10-15 NOTE — Progress Notes (Signed)
Did well w/ earlier phenobarb.  Having some increased agitation.  Plan Repeat extra phenobarb dose now  Cont taper as otherwise ordered  Simonne Martinet ACNP-BC [facility] Pulmonary/Critical Care Pager # [phone] OR # [phone] if no answer

## 2022-10-15 NOTE — Progress Notes (Signed)
   NAME:  Heather Shepherd, MRN:  [id number], DOB:  22-Jan-1991, LOS: 2 ADMISSION DATE:  10/12/2022, CONSULTATION DATE:  10/14/22 REFERRING MD:  Glade Lloyd, MD CHIEF COMPLAINT:  Alcohol withdrawal, phenobarb  History of Present Illness:  32 year old female with polysubstance and alcohol abuse and anxiety who presents with alcohol intoxication and withdrawal. Admitted to [facility].  Paitnet has abdominal pain, nausea and vomiting. On admission, UDS +cocaine, EtOh 277, AST/ALT 135/113, LA 8.1. CT head NAICA. CT A/P with not acute abnormalities with hepatomegaly and diffuse hepatic steatosis, chronic calcified pancreatitis. Started on CIWA however remain agitated. PCCM consulted for phenobarb taper for alcohol withdrawal. Of note, she was recently at [facility] prior to this admission.  On my evaluation, patient was witnessed taking unknown substance 30 min prior to my arrival. Patient states she its "probably fentanyl but not sure." She is drowsy but can answer questions and follow some commands. Tachycardic but hemodynamically stable. Had already received phenobarb 260 mg prior to ingestion of substance.  Pertinent  Medical History  As above  Significant Hospital Events: Including procedures, antibiotic start and stop dates in addition to other pertinent events   7/28 seen by PCCM for increased CIWA in spite of lorazepam. Started phenobarb  7/29 phenobarb redosed at higher dosing   Interim History / Subjective:  Agitated this am   Objective   Blood pressure (!) 141/89, pulse (!) 107, temperature 98.3 F (36.8 C), temperature source Oral, resp. rate (!) 26, height 5\' 4"  (1.626 m), weight 93 kg, last menstrual period 09/28/2022, SpO2 96%.        Intake/Output Summary (Last 24 hours) at 10/15/2022 1128 Last data filed at 10/14/2022 1656 Gross per 24 hour  Intake 1928.19 ml  Output --  Net 1928.19 ml   Filed Weights   10/12/22 4010 10/13/22 1611  Weight: 90.7 kg 93 kg   Physical  Exam: General restless agitated w/ staff wants to go home but not in distress and can be redirected HENT NCAT no JVD  Pulm clear  Card rrr Abd soft Ext warm and dry  Neuro oriented X 3 but restless and impulsive at times. Still tremulous at times. Getting repeated doses of ativan overnight   Resolved Hospital Problem list   Abdominal pain/N/V  Assessment & Plan:   Acute encephalopathy 2/2: Alcohol withdrawal/intoxication Witnessed in-hospital drug use, suspected fentanyl Still w/ active wd and agitation.  Plan Has received 2 extra doses of phenobarb (at my direction) I just think we need to be more aggressive with this (can go up to 1-1.5mg /kg or at least 1 gm in 24 hrs if needeed; we only gave 260 yesterday After appropriate symptom control will spell out taper Cont ETCO2 monitoring Stop benzos If needed for break thru I would add precedex (sometimes needed until the phenobarb at therapeutic level) folic acid, thiamine IV, multivitamin    Polysubstance and alcohol abuse Tobacco abuse Recent SI -Psychiatry consulted however patient unable to participate Plan Cont nicotine patch  Further eval pending   Best Practice (right click and "Reselect all SmartList Selections" daily)   Diet/type: NPO DVT prophylaxis: prophylactic heparin  GI prophylaxis: PPI Lines: N/A Foley:  N/A Code Status:  full code Last date of multidisciplinary goals of care discussion [per primary]   Critical care time:45 min    Simonne Martinet ACNP-BC [facility] Pulmonary/Critical Care Pager # [phone] OR # [phone] if no answer

## 2022-10-15 NOTE — Progress Notes (Addendum)
       Overnight   NAME: Heather Shepherd MRN: [id number] DOB : 05-Mar-1991    Date of Service   10/15/2022   HPI/Events of Note    Notified by RN for obvious agitation as in prior episodes.  Patient switched to Phenobarbital starting this AM. Precedex is a consideration as noted in CCM notation. Consider IVC if needed per Psych until next evaluation    Spoke with CCM via E-link    Interventions/ Plan   Start Precedex until Phenobarbital is therapeutic IVC if needed Continue previous orders       ==========================================================  Update:  10/15/22 2309 hrs  IVC is in effect.       Chinita Greenland BSN MSNA MSN ACNPC-AG Acute Care Nurse Practitioner [facility]

## 2022-10-15 NOTE — Consult Note (Signed)
[facility] Face-to-Face Psychiatry Consult   Reason for Consult:  SUicidal intent Referring Physician:  Dr. Hanley Ben Patient Identification: Heather Shepherd MRN:  [id number] Principal Diagnosis: Nausea and vomiting Diagnosis:  Principal Problem:   Nausea and vomiting Active Problems:   Tobacco abuse   Alcohol withdrawal (HCC)   Lactic acidosis   Hypomagnesemia   Acute alcoholic hepatitis   Hypokalemia   Leukopenia   Normocytic anemia   Hypophosphatemia   Pseudohyponatremia   Prediabetes   Total Time spent with patient: 1 hour  Subjective:   Heather Shepherd is a 32 y.o. female patient admitted with alcohol withdraw.   Heather Shepherd is a 32 y.o. Yo female opiate use disorder having trialed multiple substance use rehabilitation programs in the past, charted psychiatric diagnoses of bipolar disorder, depression, and SIMD with at least 1 past psychiatric hospitalization who presented to the ED on 07/26 for evaluation for alcohol withdrawal. Psychiatry was consulted due to report of suicidal ideation.  Heather Shepherd  reported suicidal ideation in the emergency room, telling the triage nurse that she had a plan to jump off the building.  She also reported that frustration with substance use prompted suicidal ideation. She reports daily intake of about 4-5 tall boys daily since her birthday. She currently resides with her mother and gives her consent to speak with her.   On initial psychiatric evaluation, mental status exam is significant for intermittent somnolence but willing engagement when she is able to stay awake, and affect is marked by emotional distress and tearfulness. She expresses self-criticism related to continued substance use and demonstrates insight into the deleterious effects of substance use. However, when asked about suicidal ideation she is clear that she no longer has suicidal ideation and is motivated to live by factors such as her belief in God and her family. She is also  adamant that she is not suicidal at this time, and that she checked in to get help for alcohol. She is able to identify strengths such as determination and motivation.   Current acute risk for suicide is low given lack of current suicidal ideation and future-oriented thinking, so suicide precautions and 1:1 are not needed. However she does remain with an elevated chronic risk of suicide given multiple past instances of suicidal ideation (per chart review it is unclear whether she has engage in past suicide attempts) and chronic substance use as well as difficulty adhering to medications. Intermittent somnolence is consistent with cocaine and opiate withdrawal and may also be a reflection of sedation due to Phenobarbital. Most likely underlying diagnosis is substance-induced mood disorder, but further diagnostic clarity is difficult to ascertain given recent substance use and historical uncertainties (e.g., whether she has truly had mood episodes during periods of sobriety). Though patient has had multiple past psychiatric medication trials and feels that suboxone was particularly helpful, she states that adherence is very difficult for her given active substance use. Will therefore hold off on recommending any psychiatric medications until further diagnostic clarity is established and patient feels that adherence will be sustainable. In addition to her somnolence and difficulty remaining awake.       HPI:  32 y.o. female with medical history significant of anxiety, depression, palpitations, dysmenorrhea, opioid dependence, polysubstance abuse, alcohol abuse, tobacco abuse presented with alcohol intoxication and withdrawal.  On presentation UDS was positive for cocaine; had elevated AST and ALT with normal lipase and total bilirubin.  CT of the head without contrast showed no acute intracranial abnormity.  CT of abdomen/pelvis  with contrast showed hepatomegaly with diffuse hepatic steatosis and sequelae of  chronic calcific pancreatitis.  She was started on CIWA protocol.  Subsequently, she was switched to phenobarbital taper.  PCCM was consulted.   Past Psychiatric History: Reports inpatient rehab in 2023; [facility] for alcohol. One isolated inpatient admission in 2017 for alcohol and depression.   Risk to Self:   Denies Risk to Others:   Denies Prior Inpatient Therapy:   Denies Prior Outpatient Therapy:   Denies  Past Medical History:  Past Medical History:  Diagnosis Date   Anxiety and depression 11/08/2011   Dysmenorrhea    Opioid dependence on agonist therapy (HCC)    Palpitations 01/22/2012   Polysubstance abuse (HCC)    Preventative health care 11/11/2011   Tobacco abuse 11/11/2011   History reviewed. No pertinent surgical history. Family History:  Family History  Problem Relation Age of Onset   Hyperlipidemia Mother    Hyperlipidemia Father    Diabetes Maternal Grandmother    Heart disease Maternal Grandfather        triple bypass   Hyperlipidemia Maternal Grandfather    Hypertension Maternal Grandfather    Heart attack Maternal Grandfather    Family Psychiatric  History: Denies Social History:  Social History   Substance and Sexual Activity  Alcohol Use Yes   Comment: occasionally     Social History   Substance and Sexual Activity  Drug Use No    Social History   Socioeconomic History   Marital status: Single    Spouse name: Not on file   Number of children: Not on file   Years of education: Not on file   Highest education level: Not on file  Occupational History   Not on file  Tobacco Use   Smoking status: Every Day    Current packs/day: 1.00    Average packs/day: 1 pack/day for 2.0 years (2.0 ttl pk-yrs)    Types: Cigarettes   Smokeless tobacco: Never  Substance and Sexual Activity   Alcohol use: Yes    Comment: occasionally   Drug use: No   Sexual activity: Yes    Partners: Male  Other Topics Concern   Not on file  Social History Narrative   Not  on file   Social Determinants of Health   Financial Resource Strain: Low Risk  (10/13/2020)   Received from [facility]   Overall Financial Resource Strain (CARDIA)    Difficulty of Paying Living Expenses: Not hard at all  Food Insecurity: Food Insecurity Present (10/13/2022)   Hunger Vital Sign    Worried About Running Out of Food in the Last Year: Often true    Ran Out of Food in the Last Year: Often true  Transportation Needs: Unmet Transportation Needs (10/13/2022)   PRAPARE - Administrator, Civil Service (Medical): Yes    Lack of Transportation (Non-Medical): Yes  Physical Activity: Inactive (10/13/2020)   Received from [facility]   Exercise Vital Sign    Days of Exercise per Week: 0 days    Minutes of Exercise per Session: 0 min  Stress: No Stress Concern Present (10/13/2020)   Received from [facility] - Occupational Stress Questionnaire    Feeling of Stress : Not at all  Social Connections: Unknown (07/21/2021)   Received from [facility]   Social Network    Social Network: Not on file   Additional Social History:    Allergies:   Allergies  Allergen Reactions   Ibuprofen Hives    Labs:  Results for orders placed or performed during the hospital encounter of 10/12/22 (from the past 48 hour(s))  Glucose, capillary     Status: Abnormal   Collection Time: 10/13/22  9:23 PM  Result Value Ref Range   Glucose-Capillary 175 (H) 70 - 99 mg/dL    Comment: Glucose reference range applies only to samples taken after fasting for at least 8 hours.   Comment 1 Notify RN    Comment 2 Document in Chart   Lactic acid, plasma     Status: Abnormal   Collection Time: 10/14/22  2:50 AM  Result Value Ref Range   Lactic Acid, Venous 3.2 (HH) 0.5 - 1.9 mmol/L    Comment: CRITICAL VALUE NOTED. VALUE IS CONSISTENT WITH PREVIOUSLY REPORTED/CALLED VALUE Performed at [facility], [address],  [place], [place] [address]   Glucose, capillary     Status: Abnormal   Collection Time: 10/14/22  8:05 AM  Result Value Ref Range   Glucose-Capillary 167 (H) 70 - 99 mg/dL    Comment: Glucose reference range applies only to samples taken after fasting for at least 8 hours.   Comment 1 Notify RN   CBC with Differential/Platelet     Status: Abnormal   Collection Time: 10/14/22 11:00 AM  Result Value Ref Range   WBC 2.9 (L) 4.0 - 10.5 K/uL   RBC 3.11 (L) 3.87 - 5.11 MIL/uL   Hemoglobin 10.3 (L) 12.0 - 15.0 g/dL   HCT 53.6 (L) 64.4 - 03.4 %   MCV 96.1 80.0 - 100.0 fL   MCH 33.1 26.0 - 34.0 pg   MCHC 34.4 30.0 - 36.0 g/dL   RDW 74.2 (H) 59.5 - 63.8 %   Platelets 228 150 - 400 K/uL   nRBC 1.0 (H) 0.0 - 0.2 %   Neutrophils Relative % 65 %   Neutro Abs 1.9 1.7 - 7.7 K/uL   Lymphocytes Relative 24 %   Lymphs Abs 0.7 0.7 - 4.0 K/uL   Monocytes Relative 8 %   Monocytes Absolute 0.2 0.1 - 1.0 K/uL   Eosinophils Relative 1 %   Eosinophils Absolute 0.0 0.0 - 0.5 K/uL   Basophils Relative 0 %   Basophils Absolute 0.0 0.0 - 0.1 K/uL   Immature Granulocytes 2 %   Abs Immature Granulocytes 0.06 0.00 - 0.07 K/uL    Comment: Performed at [facility], [address], [place], [place] [address]  Comprehensive metabolic panel     Status: Abnormal   Collection Time: 10/14/22 11:00 AM  Result Value Ref Range   Sodium 136 135 - 145 mmol/L   Potassium 3.4 (L) 3.5 - 5.1 mmol/L   Chloride 101 98 - 111 mmol/L   CO2 23 22 - 32 mmol/L   Glucose, Bld 154 (H) 70 - 99 mg/dL    Comment: Glucose reference range applies only to samples taken after fasting for at least 8 hours.   BUN <5 (L) 6 - 20 mg/dL   Creatinine, Ser 3.29 0.44 - 1.00 mg/dL   Calcium 7.9 (L) 8.9 - 10.3 mg/dL   Total Protein 5.4 (L) 6.5 - 8.1 g/dL   Albumin 2.7 (L) 3.5 - 5.0 g/dL   AST 518 (H) 15 - 41 U/L   ALT 127 (H) 0 - 44 U/L   Alkaline Phosphatase 136 (H) 38 - 126 U/L   Total Bilirubin 1.9 (H) 0.3 - 1.2 mg/dL  GFR,  Estimated >60 >60 mL/min    Comment: (NOTE) Calculated using the CKD-EPI Creatinine Equation (2021)    Anion gap 12 5 - 15    Comment: Performed at [facility], [address], [place], [place] [address]  Magnesium     Status: None   Collection Time: 10/14/22 11:00 AM  Result Value Ref Range   Magnesium 2.0 1.7 - 2.4 mg/dL    Comment: Performed at [facility], [address] [place], [place] [address]  Glucose, capillary     Status: Abnormal   Collection Time: 10/14/22 11:49 AM  Result Value Ref Range   Glucose-Capillary 154 (H) 70 - 99 mg/dL    Comment: Glucose reference range applies only to samples taken after fasting for at least 8 hours.   Comment 1 Notify RN   Glucose, capillary     Status: Abnormal   Collection Time: 10/14/22  4:21 PM  Result Value Ref Range   Glucose-Capillary 147 (H) 70 - 99 mg/dL    Comment: Glucose reference range applies only to samples taken after fasting for at least 8 hours.   Comment 1 Notify RN   Glucose, capillary     Status: Abnormal   Collection Time: 10/14/22  7:51 PM  Result Value Ref Range   Glucose-Capillary 113 (H) 70 - 99 mg/dL    Comment: Glucose reference range applies only to samples taken after fasting for at least 8 hours.   Comment 1 Notify RN    Comment 2 Document in Chart   CBC with Differential/Platelet     Status: Abnormal   Collection Time: 10/15/22  3:00 AM  Result Value Ref Range   WBC 4.6 4.0 - 10.5 K/uL   RBC 3.39 (L) 3.87 - 5.11 MIL/uL   Hemoglobin 11.2 (L) 12.0 - 15.0 g/dL   HCT 44.0 (L) 10.2 - 72.5 %   MCV 95.3 80.0 - 100.0 fL   MCH 33.0 26.0 - 34.0 pg   MCHC 34.7 30.0 - 36.0 g/dL   RDW 36.6 (H) 44.0 - 34.7 %   Platelets 225 150 - 400 K/uL   nRBC 0.7 (H) 0.0 - 0.2 %   Neutrophils Relative % 40 %   Neutro Abs 1.8 1.7 - 7.7 K/uL   Lymphocytes Relative 46 %   Lymphs Abs 2.1 0.7 - 4.0 K/uL   Monocytes Relative 9 %   Monocytes Absolute 0.4 0.1 - 1.0 K/uL   Eosinophils  Relative 2 %   Eosinophils Absolute 0.1 0.0 - 0.5 K/uL   Basophils Relative 1 %   Basophils Absolute 0.0 0.0 - 0.1 K/uL   Immature Granulocytes 2 %   Abs Immature Granulocytes 0.11 (H) 0.00 - 0.07 K/uL    Comment: Performed at [facility], [address], [place], [place] [address]  Comprehensive metabolic panel     Status: Abnormal   Collection Time: 10/15/22  3:00 AM  Result Value Ref Range   Sodium 137 135 - 145 mmol/L   Potassium 3.6 3.5 - 5.1 mmol/L   Chloride 104 98 - 111 mmol/L   CO2 21 (L) 22 - 32 mmol/L   Glucose, Bld 105 (H) 70 - 99 mg/dL    Comment: Glucose reference range applies only to samples taken after fasting for at least 8 hours.   BUN <5 (L) 6 - 20 mg/dL   Creatinine, Ser 6.38 0.44 - 1.00 mg/dL   Calcium 8.3 (L) 8.9 - 10.3 mg/dL  Total Protein 5.6 (L) 6.5 - 8.1 g/dL   Albumin 2.7 (L) 3.5 - 5.0 g/dL   AST 604 (H) 15 - 41 U/L   ALT 121 (H) 0 - 44 U/L   Alkaline Phosphatase 148 (H) 38 - 126 U/L   Total Bilirubin 1.9 (H) 0.3 - 1.2 mg/dL   GFR, Estimated >54 >09 mL/min    Comment: (NOTE) Calculated using the CKD-EPI Creatinine Equation (2021)    Anion gap 12 5 - 15    Comment: Performed at [facility], [address] [place], [place] [address]  Magnesium     Status: None   Collection Time: 10/15/22  3:00 AM  Result Value Ref Range   Magnesium 2.1 1.7 - 2.4 mg/dL    Comment: Performed at [facility], [address] [place], [place] [address]  Glucose, capillary     Status: Abnormal   Collection Time: 10/15/22  7:16 AM  Result Value Ref Range   Glucose-Capillary 125 (H) 70 - 99 mg/dL    Comment: Glucose reference range applies only to samples taken after fasting for at least 8 hours.   Comment 1 Notify RN    Comment 2 Document in Chart   Glucose, capillary     Status: Abnormal   Collection Time: 10/15/22 11:26 AM  Result Value Ref Range   Glucose-Capillary 130 (H) 70 - 99 mg/dL    Comment:  Glucose reference range applies only to samples taken after fasting for at least 8 hours.   Comment 1 Notify RN    Comment 2 Document in Chart   Glucose, capillary     Status: Abnormal   Collection Time: 10/15/22  4:25 PM  Result Value Ref Range   Glucose-Capillary 124 (H) 70 - 99 mg/dL    Comment: Glucose reference range applies only to samples taken after fasting for at least 8 hours.   Comment 1 Notify RN    Comment 2 Document in Chart     Current Facility-Administered Medications  Medication Dose Route Frequency Provider Last Rate Last Admin   acetaminophen (TYLENOL) tablet 650 mg  650 mg Oral Q6H PRN Bobette Mo, MD   650 mg at 10/13/22 2306   Or   acetaminophen (TYLENOL) suppository 650 mg  650 mg Rectal Q6H PRN Bobette Mo, MD       albuterol (PROVENTIL) (2.5 MG/3ML) 0.083% nebulizer solution 2.5 mg  2.5 mg Nebulization Q4H PRN Bobette Mo, MD       Chlorhexidine Gluconate Cloth 2 % PADS 6 each  6 each Topical Daily Bobette Mo, MD   6 each at 10/14/22 2132   folic acid (FOLVITE) tablet 1 mg  1 mg Oral Daily Elayne Snare K, DO   1 mg at 10/15/22 5621   haloperidol lactate (HALDOL) injection 5 mg  5 mg Intravenous Q4H PRN Conrad Strang, MD   5 mg at 10/15/22 0459   heparin injection 5,000 Units  5,000 Units Subcutaneous Q8H Luciano Cutter, MD   5,000 Units at 10/15/22 1352   loperamide (IMODIUM) capsule 2 mg  2 mg Oral Q4H PRN Glade Lloyd, MD   2 mg at 10/15/22 0748   multivitamin with minerals tablet 1 tablet  1 tablet Oral Daily Elayne Snare K, DO   1 tablet at 10/15/22 0841   naloxone (NARCAN) injection 0.4 mg  0.4 mg Intravenous PRN Luciano Cutter, MD       nicotine (NICODERM CQ - dosed  in mg/24 hours) patch 21 mg  21 mg Transdermal Daily Glade Lloyd, MD   21 mg at 10/15/22 0807   ondansetron (ZOFRAN) tablet 4 mg  4 mg Oral Q6H PRN Bobette Mo, MD       Or   ondansetron [facility]) injection 4 mg  4 mg Intravenous  Q6H PRN Bobette Mo, MD   4 mg at 10/14/22 1321   Oral care mouth rinse  15 mL Mouth Rinse PRN Bobette Mo, MD       pantoprazole (PROTONIX) injection 40 mg  40 mg Intravenous Q12H Glade Lloyd, MD   40 mg at 10/15/22 0841   PHENObarbital (LUMINAL) injection 130 mg  130 mg Intravenous BID Simonne Martinet, NP   130 mg at 10/15/22 0940   Followed by   Melene Muller ON 10/16/2022] PHENobarbital (LUMINAL) tablet 97.2 mg  97.2 mg Oral BID Simonne Martinet, NP       Followed by   Melene Muller ON 10/17/2022] PHENobarbital (LUMINAL) tablet 32.4 mg  32.4 mg Oral BID Simonne Martinet, NP       Followed by   Melene Muller ON 10/18/2022] PHENobarbital (LUMINAL) tablet 32.4 mg  32.4 mg Oral QHS Simonne Martinet, NP       thiamine (VITAMIN B1) tablet 100 mg  100 mg Oral Daily Kingsley, Victoria K, DO   100 mg at 10/15/22 1610    Musculoskeletal: Strength & Muscle Tone: decreased Gait & Station: unsteady Patient leans: Front            Psychiatric Specialty Exam:  Presentation  General Appearance:  Disheveled  Eye Contact: Minimal  Speech: Slow; Garbled  Speech Volume: Decreased  Handedness: Right   Mood and Affect  Mood: Anxious; Irritable  Affect: Flat; Restricted   Thought Process  Thought Processes: Linear  Descriptions of Associations:Tangential  Orientation:Partial  Thought Content:Illogical; Logical; Scattered  History of Schizophrenia/Schizoaffective disorder:No data recorded Duration of Psychotic Symptoms:No data recorded Hallucinations:Hallucinations: None  Ideas of Reference:None  Suicidal Thoughts:Suicidal Thoughts: No  Homicidal Thoughts:Homicidal Thoughts: No   Sensorium  Memory: Recent Fair; Immediate Poor; Remote Fair  Judgment: Impaired  Insight: Shallow   Executive Functions  Concentration: Poor  Attention Span: Poor  Recall: Poor  Fund of Knowledge: Fair  Language: Fair   Psychomotor Activity  Psychomotor  Activity: Psychomotor Activity: Normal; Restlessness   Assets  Assets: Desire for Improvement; Communication Skills; Physical Health; Financial Resources/Insurance; Research scientist (medical); Transportation; Housing   Sleep  Sleep: Sleep: Fair   Physical Exam: Physical Exam Vitals and nursing note reviewed.  Constitutional:      General: She is sleeping.     Appearance: She is obese.     Interventions: Nasal cannula in place.     Comments: Attempted to see patient x 3 today, very sleepy. Patient would wake up ask for ativan or suboxone and then go back to sleep.   Neurological:     General: No focal deficit present.     Mental Status: She is oriented to person, place, and time. Mental status is at baseline. She is lethargic.  Psychiatric:        Attention and Perception: She is inattentive.        Mood and Affect: Mood is anxious and depressed.        Speech: Speech is slurred.        Behavior: Behavior is uncooperative.        Thought Content: Thought content is not paranoid or delusional. Thought  content does not include suicidal ideation. Thought content does not include suicidal plan.        Cognition and Memory: Memory is impaired.        Judgment: Judgment is impulsive and inappropriate.    Review of Systems  Psychiatric/Behavioral:  Positive for depression and substance abuse (ETOH and Suboxone). Negative for hallucinations, memory loss and suicidal ideas. The patient is nervous/anxious and has insomnia.   All other systems reviewed and are negative.  Blood pressure (!) 135/90, pulse 93, temperature 99.3 F (37.4 C), temperature source Oral, resp. rate (!) 21, height 5\' 4"  (1.626 m), weight 93 kg, last menstrual period 09/28/2022, SpO2 99%. Body mass index is 35.19 kg/m.  Treatment Plan Summary: Daily contact with patient to assess and evaluate symptoms and progress in treatment, Medication management, and Plan    -Continue COWS protocol.  -TOC referral for substance use  resources and inpatient rehab.  -Continue safety sitter at this time. If patient attempts to leave prior to reassessment, will need to be placed under IVC.  -Start nicotine program, may benefit from patch or gum.  -Continue illicit substance use during hospital protocol. Continue with narcan prn.   Psych consult will continue to follow at this time.   Disposition:  Final disposition pending at this time. Patient will need to be assessed fully when she is coherent and able to participate. At this time disposition is pending however it seems patient is most suitable for inpatient rehab as she currently denies suicidal ideations or suicidal intent.   Maryagnes Amos, FNP 10/15/2022 5:44 PM

## 2022-10-15 NOTE — Progress Notes (Signed)
PROGRESS NOTE    Heather Shepherd  YNW:[id number] DOB: 02/05/1991 DOA: 10/12/2022 PCP: Pcp, No   Brief Narrative:  32 y.o. female with medical history significant of anxiety, depression, palpitations, dysmenorrhea, opioid dependence, polysubstance abuse, alcohol abuse, tobacco abuse presented with alcohol intoxication and withdrawal.  On presentation UDS was positive for cocaine; had elevated AST and ALT with normal lipase and total bilirubin.  CT of the head without contrast showed no acute intracranial abnormity.  CT of abdomen/pelvis with contrast showed hepatomegaly with diffuse hepatic steatosis and sequelae of chronic calcific pancreatitis.  She was started on CIWA protocol.  Subsequently, she was switched to phenobarbital taper.  PCCM was consulted.  Assessment & Plan:   Alcohol withdrawal Alcohol intoxication history of alcohol abuse -Currently on CIWA protocol: Ativan was switched to phenobarbital taper on 10/14/2022 because of severe agitation.  PCCM consulted.  Also on thiamine, multivitamin and folic acid.  Still actively withdrawing with severe intermittent agitation.   -TOC consult.  Abdominal pain with nausea and vomiting -Probably from gastritis from above.  Continue Protonix 40 mg IV every 12 hours. -Antiemetics as needed.  IV fluids.  Advance diet as tolerated  Acute alcoholic hepatitis with elevated LFTs and total bilirubin -CT imaging as above.  Monitor LFTs.  ?  Suicidal ideation -Patient apparently had suicidal ideation expressed to the nursing staff on 10/14/2022.  Psychiatry consulted: Patient could not remain awake on 10/14/2022.  Follow psychiatry recommendations once patient is more awake.  Hypomagnesemia -Improved  Hypophosphatemia -no labs today  Hypokalemia -Improved  Hyponatremia -Improved  Leukopenia -Improved  Anemia of chronic disease -Possibly from chronic illnesses.  Hemoglobin stable.  Polysubstance abuse/tobacco abuse -Urine drug screen  was positive for cocaine.  TOC consult.  Counseled regarding cessation/abstinence by admitting hospitalist  DVT prophylaxis: SCDs Code Status: Full Family Communication: none at bedside Disposition Plan: Status is: Inpatient Remains inpatient appropriate because: Of severity of illness    Consultants: PCCM  Procedures: None Antimicrobials: None   Subjective: Patient seen and examined at bedside.  Poor historian.  No fever, vomiting, seizures reported.  Patient is still having ongoing withdrawal and intermittent severe agitation as per nursing staff.  Bedside sitter present.   Objective: Vitals:   10/15/22 0300 10/15/22 0400 10/15/22 0458 10/15/22 0600  BP: (!) 141/91 (!) 152/113 (!) 129/95 129/76  Pulse: 89 99 92 90  Resp:      Temp:   99.2 F (37.3 C)   TempSrc:      SpO2: 98% 97% 97% 94%  Weight:      Height:        Intake/Output Summary (Last 24 hours) at 10/15/2022 0727 Last data filed at 10/14/2022 1656 Gross per 24 hour  Intake 1928.19 ml  Output --  Net 1928.19 ml   Filed Weights   10/12/22 0638 10/13/22 1611  Weight: 90.7 kg 93 kg    Examination:  General: Currently on room air.  No distress.  Chronically ill and deconditioned looking. ENT/neck: No thyromegaly.  JVD is not elevated  respiratory: Decreased breath sounds at bases bilaterally with some crackles; no wheezing  CVS: S1-S2 heard, rate controlled currently Abdominal: Soft, nontender, slightly distended; no organomegaly, bowel sounds are heard Extremities: Trace lower extremity edema; no cyanosis  CNS: Sleepy, wakes up slightly, extremely slow to respond and a poor historian.  No focal neurologic deficit.  Moves extremities Lymph: No obvious lymphadenopathy Skin: No obvious ecchymosis/lesions  psych: Extremely flat affect.  Currently not agitated.   Musculoskeletal: No  obvious joint swelling/deformity    Data Reviewed: I have personally reviewed following labs and imaging  studies  CBC: Recent Labs  Lab 10/12/22 0740 10/12/22 1012 10/13/22 1149 10/14/22 1100 10/15/22 0300  WBC 4.1  --  3.6* 2.9* 4.6  NEUTROABS 2.3  --   --  1.9 1.8  HGB 11.2* 10.5* 10.6* 10.3* 11.2*  HCT 32.1* 31.0* 31.4* 29.9* 32.3*  MCV 95.8  --  98.7 96.1 95.3  PLT 176  --  163 228 225   Basic Metabolic Panel: Recent Labs  Lab 10/12/22 0740 10/12/22 1012 10/13/22 1149 10/14/22 1100 10/15/22 0300  NA 134* 136 133* 136 137  K 3.3* 2.8* 2.9* 3.4* 3.6  CL 95*  --  95* 101 104  CO2 18*  --  21* 23 21*  GLUCOSE 235*  --  159* 154* 105*  BUN <5*  --  <5* <5* <5*  CREATININE 0.61  --  0.51 0.62 0.60  CALCIUM 8.5*  --  7.7* 7.9* 8.3*  MG  --   --  1.4* 2.0 2.1  PHOS  --   --  1.4*  --   --    GFR: Estimated Creatinine Clearance: 113.6 mL/min (by C-G formula based on SCr of 0.6 mg/dL). Liver Function Tests: Recent Labs  Lab 10/12/22 0740 10/13/22 1149 10/14/22 1100 10/15/22 0300  AST 135* 383* 248* 188*  ALT 113* 139* 127* 121*  ALKPHOS 109 122 136* 148*  BILITOT 0.8 2.1* 1.9* 1.9*  PROT 5.7* 5.5* 5.4* 5.6*  ALBUMIN 3.3* 2.7* 2.7* 2.7*   Recent Labs  Lab 10/12/22 0740  LIPASE 29   Recent Labs  Lab 10/13/22 1509  AMMONIA 33   Coagulation Profile: No results for input(s): "INR", "PROTIME" in the last 168 hours. Cardiac Enzymes: No results for input(s): "CKTOTAL", "CKMB", "CKMBINDEX", "TROPONINI" in the last 168 hours. BNP (last 3 results) No results for input(s): "PROBNP" in the last 8760 hours. HbA1C: No results for input(s): "HGBA1C" in the last 72 hours. CBG: Recent Labs  Lab 10/14/22 0805 10/14/22 1149 10/14/22 1621 10/14/22 1951 10/15/22 0716  GLUCAP 167* 154* 147* 113* 125*   Lipid Profile: No results for input(s): "CHOL", "HDL", "LDLCALC", "TRIG", "CHOLHDL", "LDLDIRECT" in the last 72 hours. Thyroid Function Tests: No results for input(s): "TSH", "T4TOTAL", "FREET4", "T3FREE", "THYROIDAB" in the last 72 hours. Anemia Panel: No results  for input(s): "VITAMINB12", "FOLATE", "FERRITIN", "TIBC", "IRON", "RETICCTPCT" in the last 72 hours. Sepsis Labs: Recent Labs  Lab 10/12/22 1501 10/13/22 1149 10/13/22 1509 10/14/22 0250  LATICACIDVEN 3.1* 4.7* 3.9* 3.2*    Recent Results (from the past 240 hour(s))  MRSA Next Gen by PCR, Nasal     Status: None   Collection Time: 10/13/22  4:09 PM   Specimen: Nasal Mucosa; Nasal Swab  Result Value Ref Range Status   MRSA by PCR Next Gen NOT DETECTED NOT DETECTED Final    Comment: (NOTE) The GeneXpert MRSA Assay (FDA approved for NASAL specimens only), is one component of a comprehensive MRSA colonization surveillance program. It is not intended to diagnose MRSA infection nor to guide or monitor treatment for MRSA infections. Test performance is not FDA approved in patients less than 55 years old. Performed at [facility], [address], [place], [place] [address]          Radiology Studies: No results found.      Scheduled Meds:  Chlorhexidine Gluconate Cloth  6 each Topical Daily   folic acid  1  mg Oral Daily   heparin injection (subcutaneous)  5,000 Units Subcutaneous Q8H   LORazepam  0-4 mg Oral Q4H   Followed by   Melene Muller ON 10/16/2022] LORazepam  0-4 mg Oral Q8H   multivitamin with minerals  1 tablet Oral Daily   nicotine  21 mg Transdermal Daily   pantoprazole (PROTONIX) IV  40 mg Intravenous Q12H   phenobarbital  97.2 mg Oral Q8H   Followed by   Melene Muller ON 10/16/2022] phenobarbital  64.8 mg Oral Q8H   Followed by   Melene Muller ON 10/18/2022] phenobarbital  32.4 mg Oral Q8H   thiamine  100 mg Oral Daily   Continuous Infusions:          Glade Lloyd, MD [facility] 10/15/2022, 7:27 AM

## 2022-10-16 LAB — GLUCOSE, CAPILLARY
Glucose-Capillary: 125 mg/dL — ABNORMAL HIGH (ref 70–99)
Glucose-Capillary: 114 mg/dL — ABNORMAL HIGH (ref 70–99)
Glucose-Capillary: 106 mg/dL — ABNORMAL HIGH (ref 70–99)
Glucose-Capillary: 121 mg/dL — ABNORMAL HIGH (ref 70–99)

## 2022-10-16 MED ORDER — ORAL CARE MOUTH RINSE: 15.0000 mL | OROMUCOSAL | Status: DC | PRN

## 2022-10-16 MED ORDER — THIAMINE MONONITRATE 100 MG PO TABS
100.0000 mg | ORAL_TABLET | Freq: Every day | ORAL | Status: DC
  Administered 2022-10-19 – 2022-10-20 (×2): 100 mg via ORAL
  Filled 2022-10-16 (×2): qty 1

## 2022-10-16 MED ORDER — ORAL CARE MOUTH RINSE
15.0000 mL | OROMUCOSAL | Status: DC
  Administered 2022-10-16: 15 mL via OROMUCOSAL

## 2022-10-16 MED ORDER — THIAMINE HCL 100 MG/ML IJ SOLN
500.0000 mg | INTRAVENOUS | Status: DC
  Administered 2022-10-16: 500 mg via INTRAVENOUS
  Filled 2022-10-16 (×2): qty 5

## 2022-10-16 NOTE — Progress Notes (Signed)
   NAME:  Heather Shepherd, MRN:  [id number], DOB:  09/11/1990, LOS: 3 ADMISSION DATE:  10/12/2022, CONSULTATION DATE:  10/14/22 REFERRING MD:  Glade Lloyd, MD CHIEF COMPLAINT:  Alcohol withdrawal, phenobarb  History of Present Illness:  32 year old female with polysubstance and alcohol abuse and anxiety who presents with alcohol intoxication and withdrawal. Admitted to [facility].  [place] has abdominal pain, nausea and vomiting. On admission, UDS +cocaine, EtOh 277, AST/ALT 135/113, LA 8.1. CT head NAICA. CT A/P with not acute abnormalities with hepatomegaly and diffuse hepatic steatosis, chronic calcified pancreatitis. Started on CIWA however remain agitated. PCCM consulted for phenobarb taper for alcohol withdrawal. Of note, she was recently at [facility] prior to this admission.  On my evaluation, patient was witnessed taking unknown substance 30 min prior to my arrival. Patient states she its "probably fentanyl but not sure." She is drowsy but can answer questions and follow some commands. Tachycardic but hemodynamically stable. Had already received phenobarb 260 mg prior to ingestion of substance.  Pertinent  Medical History  As above  Significant Hospital Events: Including procedures, antibiotic start and stop dates in addition to other pertinent events   7/28 seen by PCCM for increased CIWA in spite of lorazepam. Started phenobarb  7/29 phenobarb redosed at higher dosing   Interim History / Subjective:  On low dose precedex. Drowsy but awakened with stimulation  Objective   Blood pressure (!) 142/90, pulse 62, temperature 97.9 F (36.6 C), temperature source Axillary, resp. rate (!) 27, height 5\' 4"  (1.626 m), weight 93 kg, last menstrual period 09/28/2022, SpO2 94%.        Intake/Output Summary (Last 24 hours) at 10/16/2022 0952 Last data filed at 10/16/2022 0800 Gross per 24 hour  Intake 98.35 ml  Output --  Net 98.35 ml   Filed Weights   10/12/22 0638 10/13/22 1611  Weight:  90.7 kg 93 kg   Physical Exam: General: Well-appearing, no acute distress, drowsy HENT: [place], AT, OP clear, MMM Eyes: EOMI, no scleral icterus Respiratory: Clear to auscultation bilaterally.  No crackles, wheezing or rales Cardiovascular: RRR, -M/R/G, no JVD GI: BS+, soft, nontender Extremities:-Edema,-tenderness Neuro: Drowsy but oriented x4, CNII-XII grossly intact  LFTs slowly improving   Resolved Hospital Problem list   Abdominal pain/N/V  Assessment & Plan:   Acute encephalopathy 2/2: Alcohol withdrawal/intoxication Witnessed in-hospital drug use, suspected fentanyl Improved  Plan Weaning precedex off Continue phenobarb taper Folic acid, thiamine IV, multivitamin   Polysubstance and alcohol abuse Tobacco abuse Recent SI -Psychiatry consulted however patient unable to participate Plan Cont nicotine patch  Further eval pending   Best Practice (right click and "Reselect all SmartList Selections" daily)   Diet/type: Regular consistency (see orders) DVT prophylaxis: prophylactic heparin  GI prophylaxis: PPI Lines: N/A Foley:  N/A Code Status:  full code Last date of multidisciplinary goals of care discussion [per primary]   Critical care time: 30 min     The patient is critically ill with multiple organ systems failure and requires high complexity decision making for assessment and support, frequent evaluation and titration of therapies, application of advanced monitoring technologies and extensive interpretation of multiple databases.  Independent Critical Care Time: 30 Minutes.   Mechele Collin, M.D. [facility] Pulmonary/Critical Care Medicine 10/16/2022 9:52 AM   Please see Amion for pager number to reach on-call Pulmonary and Critical Care Team.

## 2022-10-16 NOTE — Progress Notes (Signed)
Got a little more restless Her phenobarb was held till later as was fairly sedated initially on the precedex.  This was stopped  She now has the phenobarb back on board but needed to add back low dose dex.  We will get IVC given that she is threatening to leave and clearly cannot make that decision safely at this time  Heather Shepherd ACNP-BC [facility] Pulmonary/Critical Care Pager # [phone] OR # [phone] if no answer

## 2022-10-16 NOTE — Progress Notes (Signed)
Patient was started on Precedex drip overnight for agitation.  Patient will be transferred to [facility] service for today: This has been communicated with PCCM team via secure chat.  PCCM is hopeful that she can come off Precedex drip soon and care will possibly be transferred back to [facility] service from tomorrow onwards.

## 2022-10-16 NOTE — Consult Note (Cosign Needed Addendum)
[facility] Face-to-Face Psychiatry Consult   Reason for Consult:  SUicidal intent Referring Physician:  Dr. Hanley Ben Patient Identification: Heather Shepherd MRN:  [id number] Principal Diagnosis: Nausea and vomiting Diagnosis:  Principal Problem:   Nausea and vomiting Active Problems:   Tobacco abuse   Alcohol withdrawal (HCC)   Polysubstance use disorder   Lactic acidosis   Hypomagnesemia   Acute alcoholic hepatitis   Hypokalemia   Leukopenia   Normocytic anemia   Hypophosphatemia   Pseudohyponatremia   Prediabetes   Total Time spent with patient: 20 minutes  Subjective:   Heather Shepherd is a 32 y.o. female patient admitted with alcohol withdraw.   Heather Shepherd is a 32 y.o. Yo female opiate use disorder having trialed multiple substance use rehabilitation programs in the past, charted psychiatric diagnoses of bipolar disorder, depression, and SIMD with at least 1 past psychiatric hospitalization who presented to the ED on 07/26 for evaluation for alcohol withdrawal. Psychiatry was consulted due to report of suicidal ideation.  Heather Shepherd  reported suicidal ideation in the emergency room, telling the triage nurse that she had a plan to jump off the building.  She also reported that frustration with substance use prompted suicidal ideation. She reports daily intake of about 4-5 tall boys daily since her birthday. She currently resides with her mother and gives her consent to speak with her.   On initial psychiatric evaluation, mental status exam is significant for deep somnolence. Patient remained difficult to arouse and stay awake, despite physical and vigorous shaking. Discussed with nursing staff, she had a rough night and had to be placed on Precedex over night. At the time of the assessment (attempt) patient was off Precedex however would not awake. Nursing advised to reach out once patient was alert and able to participate. Patient was asleep most of the morning into the afternoon  around 2p.   HPI:  32 y.o. female with medical history significant of anxiety, depression, palpitations, dysmenorrhea, opioid dependence, polysubstance abuse, alcohol abuse, tobacco abuse presented with alcohol intoxication and withdrawal.  On presentation UDS was positive for cocaine; had elevated AST and ALT with normal lipase and total bilirubin.  CT of the head without contrast showed no acute intracranial abnormity.  CT of abdomen/pelvis with contrast showed hepatomegaly with diffuse hepatic steatosis and sequelae of chronic calcific pancreatitis.  She was started on CIWA protocol.  Subsequently, she was switched to phenobarbital taper.  PCCM was consulted.   Past Psychiatric History: Reports inpatient rehab in 2023; [facility] for alcohol. One isolated inpatient admission in 2017 for alcohol and depression.   Risk to Self:   Denies Risk to Others:   Denies Prior Inpatient Therapy:   Denies Prior Outpatient Therapy:   Denies  Past Medical History:  Past Medical History:  Diagnosis Date   Anxiety and depression 11/08/2011   Dysmenorrhea    Opioid dependence on agonist therapy (HCC)    Palpitations 01/22/2012   Polysubstance abuse (HCC)    Preventative health care 11/11/2011   Tobacco abuse 11/11/2011   History reviewed. No pertinent surgical history. Family History:  Family History  Problem Relation Age of Onset   Hyperlipidemia Mother    Hyperlipidemia Father    Diabetes Maternal Grandmother    Heart disease Maternal Grandfather        triple bypass   Hyperlipidemia Maternal Grandfather    Hypertension Maternal Grandfather    Heart attack Maternal Grandfather    Family Psychiatric  History: Denies Social History:  Social History  Substance and Sexual Activity  Alcohol Use Yes   Comment: occasionally     Social History   Substance and Sexual Activity  Drug Use No    Social History   Socioeconomic History   Marital status: Single    Spouse name: Not on file   Number of  children: Not on file   Years of education: Not on file   Highest education level: Not on file  Occupational History   Not on file  Tobacco Use   Smoking status: Every Day    Current packs/day: 1.00    Average packs/day: 1 pack/day for 2.0 years (2.0 ttl pk-yrs)    Types: Cigarettes   Smokeless tobacco: Never  Substance and Sexual Activity   Alcohol use: Yes    Comment: occasionally   Drug use: No   Sexual activity: Yes    Partners: Male  Other Topics Concern   Not on file  Social History Narrative   Not on file   Social Determinants of Health   Financial Resource Strain: Low Risk  (10/13/2020)   Received from [facility]   Overall Financial Resource Strain (CARDIA)    Difficulty of Paying Living Expenses: Not hard at all  Food Insecurity: Food Insecurity Present (10/13/2022)   Hunger Vital Sign    Worried About Running Out of Food in the Last Year: Often true    Ran Out of Food in the Last Year: Often true  Transportation Needs: Unmet Transportation Needs (10/13/2022)   PRAPARE - Administrator, Civil Service (Medical): Yes    Lack of Transportation (Non-Medical): Yes  Physical Activity: Inactive (10/13/2020)   Received from [facility]   Exercise Vital Sign    Days of Exercise per Week: 0 days    Minutes of Exercise per Session: 0 min  Stress: No Stress Concern Present (10/13/2020)   Received from [facility] - Occupational Stress Questionnaire    Feeling of Stress : Not at all  Social Connections: Unknown (07/21/2021)   Received from [facility]   Social Network    Social Network: Not on file   Additional Social History:    Allergies:   Allergies  Allergen Reactions   Ibuprofen Hives    Labs:  Results for orders placed or performed during the hospital encounter of 10/12/22 (from the past 48 hour(s))  Glucose, capillary     Status: Abnormal   Collection Time: 10/14/22  4:21 PM  Result Value Ref  Range   Glucose-Capillary 147 (H) 70 - 99 mg/dL    Comment: Glucose reference range applies only to samples taken after fasting for at least 8 hours.   Comment 1 Notify RN   Glucose, capillary     Status: Abnormal   Collection Time: 10/14/22  7:51 PM  Result Value Ref Range   Glucose-Capillary 113 (H) 70 - 99 mg/dL    Comment: Glucose reference range applies only to samples taken after fasting for at least 8 hours.   Comment 1 Notify RN    Comment 2 Document in Chart   CBC with Differential/Platelet     Status: Abnormal   Collection Time: 10/15/22  3:00 AM  Result Value Ref Range   WBC 4.6 4.0 - 10.5 K/uL   RBC 3.39 (L) 3.87 - 5.11 MIL/uL   Hemoglobin 11.2 (L) 12.0 - 15.0 g/dL   HCT 16.1 (L) 09.6 - 04.5 %   MCV 95.3 80.0 - 100.0 fL  MCH 33.0 26.0 - 34.0 pg   MCHC 34.7 30.0 - 36.0 g/dL   RDW 16.1 (H) 09.6 - 04.5 %   Platelets 225 150 - 400 K/uL   nRBC 0.7 (H) 0.0 - 0.2 %   Neutrophils Relative % 40 %   Neutro Abs 1.8 1.7 - 7.7 K/uL   Lymphocytes Relative 46 %   Lymphs Abs 2.1 0.7 - 4.0 K/uL   Monocytes Relative 9 %   Monocytes Absolute 0.4 0.1 - 1.0 K/uL   Eosinophils Relative 2 %   Eosinophils Absolute 0.1 0.0 - 0.5 K/uL   Basophils Relative 1 %   Basophils Absolute 0.0 0.0 - 0.1 K/uL   Immature Granulocytes 2 %   Abs Immature Granulocytes 0.11 (H) 0.00 - 0.07 K/uL    Comment: Performed at [facility], [address], [place], [place] [address]  Comprehensive metabolic panel     Status: Abnormal   Collection Time: 10/15/22  3:00 AM  Result Value Ref Range   Sodium 137 135 - 145 mmol/L   Potassium 3.6 3.5 - 5.1 mmol/L   Chloride 104 98 - 111 mmol/L   CO2 21 (L) 22 - 32 mmol/L   Glucose, Bld 105 (H) 70 - 99 mg/dL    Comment: Glucose reference range applies only to samples taken after fasting for at least 8 hours.   BUN <5 (L) 6 - 20 mg/dL   Creatinine, Ser 1.91 0.44 - 1.00 mg/dL   Calcium 8.3 (L) 8.9 - 10.3 mg/dL   Total Protein 5.6 (L) 6.5 - 8.1  g/dL   Albumin 2.7 (L) 3.5 - 5.0 g/dL   AST 478 (H) 15 - 41 U/L   ALT 121 (H) 0 - 44 U/L   Alkaline Phosphatase 148 (H) 38 - 126 U/L   Total Bilirubin 1.9 (H) 0.3 - 1.2 mg/dL   GFR, Estimated >29 >56 mL/min    Comment: (NOTE) Calculated using the CKD-EPI Creatinine Equation (2021)    Anion gap 12 5 - 15    Comment: Performed at [facility], [address] [place], [place] [address]  Magnesium     Status: None   Collection Time: 10/15/22  3:00 AM  Result Value Ref Range   Magnesium 2.1 1.7 - 2.4 mg/dL    Comment: Performed at [facility], [address] [place], [place] [address]  Glucose, capillary     Status: Abnormal   Collection Time: 10/15/22  7:16 AM  Result Value Ref Range   Glucose-Capillary 125 (H) 70 - 99 mg/dL    Comment: Glucose reference range applies only to samples taken after fasting for at least 8 hours.   Comment 1 Notify RN    Comment 2 Document in Chart   Glucose, capillary     Status: Abnormal   Collection Time: 10/15/22 11:26 AM  Result Value Ref Range   Glucose-Capillary 130 (H) 70 - 99 mg/dL    Comment: Glucose reference range applies only to samples taken after fasting for at least 8 hours.   Comment 1 Notify RN    Comment 2 Document in Chart   Glucose, capillary     Status: Abnormal   Collection Time: 10/15/22  4:25 PM  Result Value Ref Range   Glucose-Capillary 124 (H) 70 - 99 mg/dL    Comment: Glucose reference range applies only to samples taken after fasting for at least 8 hours.   Comment 1 Notify RN    Comment  2 Document in Chart   Glucose, capillary     Status: Abnormal   Collection Time: 10/15/22  8:32 PM  Result Value Ref Range   Glucose-Capillary 111 (H) 70 - 99 mg/dL    Comment: Glucose reference range applies only to samples taken after fasting for at least 8 hours.  Glucose, capillary     Status: Abnormal   Collection Time: 10/15/22 10:54 PM  Result Value Ref Range   Glucose-Capillary 118 (H)  70 - 99 mg/dL    Comment: Glucose reference range applies only to samples taken after fasting for at least 8 hours.  Comprehensive metabolic panel     Status: Abnormal   Collection Time: 10/16/22  3:18 AM  Result Value Ref Range   Sodium 140 135 - 145 mmol/L   Potassium 3.6 3.5 - 5.1 mmol/L   Chloride 106 98 - 111 mmol/L   CO2 21 (L) 22 - 32 mmol/L   Glucose, Bld 119 (H) 70 - 99 mg/dL    Comment: Glucose reference range applies only to samples taken after fasting for at least 8 hours.   BUN <5 (L) 6 - 20 mg/dL   Creatinine, Ser 7.25 0.44 - 1.00 mg/dL   Calcium 8.6 (L) 8.9 - 10.3 mg/dL   Total Protein 5.7 (L) 6.5 - 8.1 g/dL   Albumin 2.8 (L) 3.5 - 5.0 g/dL   AST 366 (H) 15 - 41 U/L   ALT 103 (H) 0 - 44 U/L   Alkaline Phosphatase 153 (H) 38 - 126 U/L   Total Bilirubin 2.0 (H) 0.3 - 1.2 mg/dL   GFR, Estimated >44 >03 mL/min    Comment: (NOTE) Calculated using the CKD-EPI Creatinine Equation (2021)    Anion gap 13 5 - 15    Comment: Performed at [facility], [address] [place], [place] [address]  Magnesium     Status: None   Collection Time: 10/16/22  3:18 AM  Result Value Ref Range   Magnesium 2.3 1.7 - 2.4 mg/dL    Comment: Performed at [facility], [address], [place], [place] [address]  Glucose, capillary     Status: Abnormal   Collection Time: 10/16/22  8:06 AM  Result Value Ref Range   Glucose-Capillary 125 (H) 70 - 99 mg/dL    Comment: Glucose reference range applies only to samples taken after fasting for at least 8 hours.   Comment 1 Notify RN    Comment 2 Document in Chart   Glucose, capillary     Status: Abnormal   Collection Time: 10/16/22 11:43 AM  Result Value Ref Range   Glucose-Capillary 114 (H) 70 - 99 mg/dL    Comment: Glucose reference range applies only to samples taken after fasting for at least 8 hours.   Comment 1 Notify RN    Comment 2 Document in Chart     Current Facility-Administered Medications   Medication Dose Route Frequency Provider Last Rate Last Admin   acetaminophen (TYLENOL) tablet 650 mg  650 mg Oral Q6H PRN Bobette Mo, MD   650 mg at 10/13/22 2306   Or   acetaminophen (TYLENOL) suppository 650 mg  650 mg Rectal Q6H PRN Bobette Mo, MD       albuterol (PROVENTIL) (2.5 MG/3ML) 0.083% nebulizer solution 2.5 mg  2.5 mg Nebulization Q4H PRN Bobette Mo, MD       Chlorhexidine Gluconate Cloth 2 % PADS 6 each  6 each Topical Daily Robb Matar,  Ernestene Kiel, MD   6 each at 10/16/22 0650   dexmedetomidine (PRECEDEX) 200 MCG/50ML (4 mcg/mL) infusion  0-1.2 mcg/kg/hr Intravenous Titrated Luiz Iron, NP 9.3 mL/hr at 10/16/22 1411 0.4 mcg/kg/hr at 10/16/22 1411   folic acid (FOLVITE) tablet 1 mg  1 mg Oral Daily Elayne Snare K, DO   1 mg at 10/16/22 1253   haloperidol lactate (HALDOL) injection 5 mg  5 mg Intravenous Q4H PRN Conrad [place], MD   5 mg at 10/15/22 1940   heparin injection 5,000 Units  5,000 Units Subcutaneous Q8H Luciano Cutter, MD   5,000 Units at 10/16/22 1411   loperamide (IMODIUM) capsule 2 mg  2 mg Oral Q4H PRN Glade Lloyd, MD   2 mg at 10/15/22 2030   multivitamin with minerals tablet 1 tablet  1 tablet Oral Daily Elayne Snare K, DO   1 tablet at 10/16/22 1253   nicotine (NICODERM CQ - dosed in mg/24 hours) patch 21 mg  21 mg Transdermal Daily Glade Lloyd, MD   21 mg at 10/16/22 0957   ondansetron (ZOFRAN) tablet 4 mg  4 mg Oral Q6H PRN Bobette Mo, MD       Or   ondansetron [facility]) injection 4 mg  4 mg Intravenous Q6H PRN Bobette Mo, MD   4 mg at 10/16/22 1342   Oral care mouth rinse  15 mL Mouth Rinse PRN Luciano Cutter, MD       pantoprazole (PROTONIX) injection 40 mg  40 mg Intravenous Q12H Glade Lloyd, MD   40 mg at 10/16/22 0957   PHENobarbital (LUMINAL) tablet 97.2 mg  97.2 mg Oral BID Simonne Martinet, NP   97.2 mg at 10/16/22 1253   Followed by   Melene Muller ON 10/17/2022] PHENobarbital (LUMINAL)  tablet 32.4 mg  32.4 mg Oral BID Simonne Martinet, NP       Followed by   Melene Muller ON 10/18/2022] PHENobarbital (LUMINAL) tablet 32.4 mg  32.4 mg Oral QHS Simonne Martinet, NP       thiamine (VITAMIN B1) 500 mg in sodium chloride 0.9 % 50 mL IVPB  500 mg Intravenous Q24H Maryagnes Amos, FNP   Stopped at 10/16/22 1037   And   [START ON 10/19/2022] thiamine (VITAMIN B1) tablet 100 mg  100 mg Oral Daily Maryagnes Amos, FNP        Musculoskeletal: Strength & Muscle Tone: decreased Gait & Station: unsteady Patient leans: Front            Psychiatric Specialty Exam:  Deferred due to lethargy    Physical Exam: Physical Exam Vitals and nursing note reviewed.  Constitutional:      General: She is sleeping.     Appearance: She is obese.     Interventions: Nasal cannula in place.     Comments: Attempted to see patient today, very sleepy.   Neurological:     General: No focal deficit present.     Mental Status: She is oriented to person, place, and time. Mental status is at baseline. She is lethargic.  Psychiatric:        Attention and Perception: She is attentive.        Mood and Affect: Mood is not depressed.        Speech: Speech is not slurred.        Behavior: Behavior is uncooperative.        Thought Content: Thought content is not paranoid or delusional. Thought content does  not include suicidal ideation. Thought content does not include suicidal plan.        Cognition and Memory: Memory is not impaired.        Judgment: Judgment is not impulsive or inappropriate.    Review of Systems  Psychiatric/Behavioral:  Positive for depression and substance abuse (ETOH and Suboxone). Negative for hallucinations, memory loss and suicidal ideas. The patient is nervous/anxious and has insomnia.   All other systems reviewed and are negative.  Blood pressure 131/75, pulse 83, temperature 99.7 F (37.6 C), temperature source Oral, resp. rate (!) 29, height 5\' 4"  (1.626 m),  weight 93 kg, last menstrual period 09/28/2022, SpO2 97%. Body mass index is 35.19 kg/m.  Treatment Plan Summary: Daily contact with patient to assess and evaluate symptoms and progress in treatment, Medication management, and Plan    -Continue COWS protocol. -TOC referral for substance use resources and inpatient rehab.  -Continue safety sitter at this time. -Patient continues to meet criteria for IVC due to her safety, inability to participate in psych evaluation, and she continues to threaten to leave. Will place under IVC.  -Continue illicit substance use during hospital protocol. Continue with narcan prn. -Will start IV thiamine 500mg  x 3 days.   Psych consult will continue to follow at this time.   Disposition:  Final disposition pending at this time. Patient will need to be assessed fully when she is coherent and able to participate. At this time disposition is pending however it seems patient is most suitable for inpatient rehab as she currently denies suicidal ideations or suicidal intent.   Maryagnes Amos, FNP 10/16/2022 3:49 PM

## 2022-10-16 NOTE — Progress Notes (Signed)
Pt noted to have some bright red blood in her mouth while doing oral care. Not able to find where blood is coming from. Will continue to monitor.

## 2022-10-16 NOTE — TOC Progression Note (Signed)
Transition of Care [facility]) - Progression Note    Patient Details  Name: Heather Shepherd MRN: [id number] Date of Birth: [birth year]  Transition of [facility] CM/SW Contact  Amada Jupiter, LCSW Phone Number: 10/16/2022, 6:08 PM  Clinical Narrative:     IVC paperwork completed by NP and uploaded to [employer] filing system.  Findings and custody order received.  4 copies made and [employer] called to complete custody section.  Completed IVC forms placed on shadow chart.       Expected Discharge Plan and Services                                               Social Determinants of Health (SDOH) Interventions SDOH Screenings   Food Insecurity: Food Insecurity Present (10/13/2022)  Housing: High Risk (10/13/2022)  Transportation Needs: Unmet Transportation Needs (10/13/2022)  Utilities: At Risk (10/13/2022)  Financial Resource Strain: Low Risk  (10/13/2020)   Received from [facility]  Physical Activity: Inactive (10/13/2020)   Received from [facility]  Social Connections: Unknown (07/21/2021)   Received from [facility]  Stress: No Stress Concern Present (10/13/2020)   Received from [facility]  Tobacco Use: High Risk (10/12/2022)    Readmission Risk Interventions     No data to display

## 2022-10-16 NOTE — Plan of Care (Signed)
°  Problem: Coping: °Goal: Level of anxiety will decrease °Outcome: Progressing °  °

## 2022-10-16 NOTE — Plan of Care (Signed)
  Problem: Health Behavior/Discharge Planning: Goal: Ability to manage health-related needs will improve Outcome: Not Progressing   Problem: Clinical Measurements: Goal: Will remain free from infection Outcome: Progressing Goal: Respiratory complications will improve Outcome: Progressing   Problem: Elimination: Goal: Will not experience complications related to bowel motility Outcome: Progressing Goal: Will not experience complications related to urinary retention Outcome: Progressing

## 2022-10-16 NOTE — TOC CM/SW Note (Signed)
Have received TOC referral for SA work up. Continue to monitor pt's progress but remains agitated today.  Will follow and will meet with pt when she is more alert and able to engage.

## 2022-10-17 LAB — BLOOD GAS, VENOUS
Acid-Base Excess: 2.7 mmol/L — ABNORMAL HIGH (ref 0.0–2.0)
Bicarbonate: 26.2 mmol/L (ref 20.0–28.0)
O2 Saturation: 92.6 %
Patient temperature: 36.9
pCO2, Ven: 36 mmHg — ABNORMAL LOW (ref 44–60)
pH, Ven: 7.47 — ABNORMAL HIGH (ref 7.25–7.43)
pO2, Ven: 62 mmHg — ABNORMAL HIGH (ref 32–45)

## 2022-10-17 LAB — BETA-HYDROXYBUTYRIC ACID: Beta-Hydroxybutyric Acid: 3.15 mmol/L — ABNORMAL HIGH (ref 0.05–0.27)

## 2022-10-17 LAB — CBC WITH DIFFERENTIAL/PLATELET
Abs Immature Granulocytes: 0.11 10*3/uL — ABNORMAL HIGH (ref 0.00–0.07)
Basophils Absolute: 0.1 10*3/uL (ref 0.0–0.1)
Basophils Relative: 1 %
Eosinophils Absolute: 0.1 10*3/uL (ref 0.0–0.5)
Eosinophils Relative: 2 %
HCT: 35.7 % — ABNORMAL LOW (ref 36.0–46.0)
Hemoglobin: 11.9 g/dL — ABNORMAL LOW (ref 12.0–15.0)
Immature Granulocytes: 1 %
Lymphocytes Relative: 28 %
Lymphs Abs: 2.4 10*3/uL (ref 0.7–4.0)
MCH: 33.7 pg (ref 26.0–34.0)
MCHC: 33.3 g/dL (ref 30.0–36.0)
MCV: 101.1 fL — ABNORMAL HIGH (ref 80.0–100.0)
Monocytes Absolute: 0.8 10*3/uL (ref 0.1–1.0)
Monocytes Relative: 9 %
Neutro Abs: 5.2 10*3/uL (ref 1.7–7.7)
Neutrophils Relative %: 59 %
Platelets: 234 10*3/uL (ref 150–400)
RBC: 3.53 MIL/uL — ABNORMAL LOW (ref 3.87–5.11)
RDW: 20.2 % — ABNORMAL HIGH (ref 11.5–15.5)
WBC: 8.7 10*3/uL (ref 4.0–10.5)
nRBC: 0 % (ref 0.0–0.2)

## 2022-10-17 LAB — BASIC METABOLIC PANEL
Anion gap: 17 — ABNORMAL HIGH (ref 5–15)
BUN: 6 mg/dL (ref 6–20)
CO2: 16 mmol/L — ABNORMAL LOW (ref 22–32)
Calcium: 8.4 mg/dL — ABNORMAL LOW (ref 8.9–10.3)
Chloride: 104 mmol/L (ref 98–111)
Creatinine, Ser: 0.6 mg/dL (ref 0.44–1.00)
GFR, Estimated: >60 mL/min (ref >60)
Glucose, Bld: 113 mg/dL — ABNORMAL HIGH (ref 70–99)
Potassium: 3.8 mmol/L (ref 3.5–5.1)
Sodium: 137 mmol/L (ref 135–145)

## 2022-10-17 LAB — HEPATIC FUNCTION PANEL
ALT: 73 U/L — ABNORMAL HIGH (ref 0–44)
AST: 77 U/L — ABNORMAL HIGH (ref 15–41)
Albumin: 2.7 g/dL — ABNORMAL LOW (ref 3.5–5.0)
Alkaline Phosphatase: 135 U/L — ABNORMAL HIGH (ref 38–126)
Bilirubin, Direct: 1 mg/dL — ABNORMAL HIGH (ref 0.0–0.2)
Indirect Bilirubin: 1.3 mg/dL — ABNORMAL HIGH (ref 0.3–0.9)
Total Bilirubin: 2.3 mg/dL — ABNORMAL HIGH (ref 0.3–1.2)
Total Protein: 6 g/dL — ABNORMAL LOW (ref 6.5–8.1)

## 2022-10-17 LAB — GLUCOSE, CAPILLARY
Glucose-Capillary: 127 mg/dL — ABNORMAL HIGH (ref 70–99)
Glucose-Capillary: 136 mg/dL — ABNORMAL HIGH (ref 70–99)
Glucose-Capillary: 129 mg/dL — ABNORMAL HIGH (ref 70–99)
Glucose-Capillary: 126 mg/dL — ABNORMAL HIGH (ref 70–99)

## 2022-10-17 LAB — LACTIC ACID, PLASMA: Lactic Acid, Venous: 1.1 mmol/L (ref 0.5–1.9)

## 2022-10-17 MED ORDER — THIAMINE MONONITRATE 100 MG PO TABS
500.0000 mg | ORAL_TABLET | Freq: Every day | ORAL | Status: AC
  Administered 2022-10-17 – 2022-10-18 (×2): 500 mg via ORAL
  Filled 2022-10-17 (×2): qty 5

## 2022-10-17 MED ORDER — DEXMEDETOMIDINE HCL IN NACL 400 MCG/100ML IV SOLN
0.0000 ug/kg/h | INTRAVENOUS | Status: DC
  Filled 2022-10-17: qty 100

## 2022-10-17 MED ORDER — PHENOBARBITAL 32.4 MG PO TABS
32.4000 mg | ORAL_TABLET | Freq: Every day | ORAL | Status: AC
  Administered 2022-10-20: 32.4 mg via ORAL
  Filled 2022-10-17: qty 1

## 2022-10-17 MED ORDER — PHENOBARBITAL 32.4 MG PO TABS
32.4000 mg | ORAL_TABLET | Freq: Two times a day (BID) | ORAL | Status: AC
  Administered 2022-10-19 (×2): 32.4 mg via ORAL
  Filled 2022-10-17 (×2): qty 1

## 2022-10-17 MED ORDER — LORAZEPAM 2 MG/ML IJ SOLN
1.0000 mg | INTRAMUSCULAR | Status: DC | PRN
  Administered 2022-10-17 – 2022-10-18 (×6): 1 mg via INTRAVENOUS
  Filled 2022-10-17 (×6): qty 1

## 2022-10-17 MED ORDER — PHENOBARBITAL 64.8 MG PO TABS
64.8000 mg | ORAL_TABLET | Freq: Two times a day (BID) | ORAL | Status: AC
  Administered 2022-10-18 (×2): 64.8 mg via ORAL
  Filled 2022-10-17 (×2): qty 2

## 2022-10-17 MED ORDER — PHENOBARBITAL 32.4 MG PO TABS
97.2000 mg | ORAL_TABLET | Freq: Two times a day (BID) | ORAL | Status: AC
  Administered 2022-10-17 (×2): 97.2 mg via ORAL
  Filled 2022-10-17 (×2): qty 3

## 2022-10-17 MED ORDER — MELATONIN 3 MG PO TABS
3.0000 mg | ORAL_TABLET | Freq: Every evening | ORAL | Status: DC | PRN
  Administered 2022-10-17 – 2022-10-19 (×3): 3 mg via ORAL
  Filled 2022-10-17 (×3): qty 1

## 2022-10-17 MED ORDER — PANTOPRAZOLE SODIUM 40 MG PO TBEC
40.0000 mg | DELAYED_RELEASE_TABLET | Freq: Two times a day (BID) | ORAL | Status: DC
  Administered 2022-10-17 – 2022-10-20 (×7): 40 mg via ORAL
  Filled 2022-10-17 (×7): qty 1

## 2022-10-17 MED ORDER — GABAPENTIN 100 MG PO CAPS
200.0000 mg | ORAL_CAPSULE | Freq: Three times a day (TID) | ORAL | Status: DC
  Administered 2022-10-17 – 2022-10-20 (×9): 200 mg via ORAL
  Filled 2022-10-17 (×9): qty 2

## 2022-10-17 MED ORDER — LACTATED RINGERS IV BOLUS
1000.0000 mL | Freq: Once | INTRAVENOUS | Status: AC
  Administered 2022-10-17: 1000 mL via INTRAVENOUS

## 2022-10-17 NOTE — Progress Notes (Signed)
Patient becoming more anxious, Heather Laine, NP notified, patient received scheduled medications and PRN ativan without effect, Precedex titrated per order. Emotional support and environmental changes provided. Patient attempting to rest. Sitter at bedside.

## 2022-10-17 NOTE — Progress Notes (Signed)
Patient continues to be agitated requiring PRN haldol, not redirectable, attempting to get out of bed, yelling at staff, precedex increased per order.

## 2022-10-17 NOTE — Progress Notes (Signed)
NAME:  Heather Shepherd, MRN:  [id number], DOB:  1990/04/26, LOS: 4 ADMISSION DATE:  10/12/2022, CONSULTATION DATE:  10/14/22 REFERRING MD:  Glade Lloyd, MD CHIEF COMPLAINT:  Alcohol withdrawal, phenobarb  History of Present Illness:  32 year old female with polysubstance and alcohol abuse and anxiety who presents with alcohol intoxication and withdrawal. Admitted to [facility].  Paitnet has abdominal pain, nausea and vomiting. On admission, UDS +cocaine, EtOh 277, AST/ALT 135/113, LA 8.1. CT head NAICA. CT A/P with not acute abnormalities with hepatomegaly and diffuse hepatic steatosis, chronic calcified pancreatitis. Started on CIWA however remain agitated. PCCM consulted for phenobarb taper for alcohol withdrawal. Of note, she was recently at [facility] prior to this admission.  On my evaluation, patient was witnessed taking unknown substance 30 min prior to my arrival. Patient states she its "probably fentanyl but not sure." She is drowsy but can answer questions and follow some commands. Tachycardic but hemodynamically stable. Had already received phenobarb 260 mg prior to ingestion of substance.  Pertinent  Medical History  As above  Significant Hospital Events: Including procedures, antibiotic start and stop dates in addition to other pertinent events   7/28 seen by PCCM for increased CIWA in spite of lorazepam. Started phenobarb  7/29 phenobarb redosed at higher dosing  7/30 IVC'd, Psych consult, ongoing precedex  Interim History / Subjective:  Dex weaning this am, required 1 overnight, now on 0.5.   Safety sitter remains at bedside  No UOP overnight, no PO intake, bladder scan this am ~115 ml  Objective   Blood pressure (!) 145/92, pulse (!) 49, temperature 97.7 F (36.5 C), temperature source Oral, resp. rate 14, height 5\' 4"  (1.626 m), weight 93 kg, last menstrual period 09/28/2022, SpO2 95%.        Intake/Output Summary (Last 24 hours) at 10/17/2022 7846 Last data filed at  10/17/2022 0719 Gross per 24 hour  Intake 321.46 ml  Output --  Net 321.46 ml   Filed Weights   10/12/22 9629 10/13/22 1611  Weight: 90.7 kg 93 kg   Physical Exam: General:  Young adult female sitting upright in bed in NAD, sitter at bedside HEENT: MM pink/moist, lips chapped, pupils 4/r, anicteric Neuro: somnolent, wakes up to verbal, slowed responses but oriented x 3, still confused on event, f/c, MAE, no tremors CV: rr, SB 50s, no murmur PULM:  non labored, CTA, room air GI: soft, bs+, mild RUQ tenderness, no distention, no suprapubic tenderness/ distention  Extremities: warm/dry, no LE edema  Skin: scattered ecchymosis to extremities, UE> LE  Labs reviewed> bicarb 21> 16, AG 13> 17, glucose 113, sCr 0.6, CBC stable  Resolved Hospital Problem list   Abdominal pain/N/V  Assessment & Plan:   Acute encephalopathy 2/2, improving  Alcohol withdrawal/intoxication Witnessed in-hospital drug use, suspected fentanyl Polysubstance and alcohol abuse Tobacco abuse Recent SI s/p Involuntary committed 7/30 Plan - cont to wean precedex, CIWA  - hold phenobarb taper> cont today at 97mg  BID given higher precedex requirements and start taper 8/1.  Add ativan 1mg  q4hr PRN  - cont high dose thiamine x 3 days - cont folic acid and MVI  - appreciate Psych recommendations - remains on IVC per psych w/ safety sitter  - cont nicotine patch  - eventual TOC, cessation counseling when appropriate  - check TSH and B12   Transaminitis Elevated t. Bili  - CT a/p on admit> hepatomegaly c/w hepatic steatosis, chronic calcific pancreatitis with no acute inflammatory changes, no acute intra-abd/ intrapelvic process  Plan - LFTs were improving> repeat today  Oliguria - poor PO intake with agitation/ sedation on precedex.  Will minimize as able to day - bladder scan only 100 ml, no UOP overnight.  sCr remains unchanged.  Bladder scan prn, monitor UOP closely - serial renal indices  - LR bolus x 1  and follow. If not taking PO's today, will need to add MVIF  Anion Gap metabolic acidosis - new, unclear etiology.  Renal function stable.  Check lactic, repeat LFTs, beta-hydroxybutyric acid despite normal glucose.  ?ketosis from not eating although UA neg on admit.  Consider checking osmolar gap  Macrocytic anemia - H/H stable - check folate and B12 level - trend CBC, transfuse for Hgb < 7  Best Practice (right click and "Reselect all SmartList Selections" daily)   Diet/type: Regular consistency (see orders) DVT prophylaxis: prophylactic heparin  GI prophylaxis: PPI Lines: N/A Foley:  N/A Code Status:  full code Last date of multidisciplinary goals of care discussion [per primary]   Critical care time: 32 min      Posey Boyer, MSN, NP, AG-ACNP-BC [facility] 10/17/2022, 9:04 AM  See Amion for pager If no response to pager , please call [phone] until 7pm After 7:00 pm call Elink  [phone]

## 2022-10-17 NOTE — Consult Note (Addendum)
[facility] Face-to-Face Psychiatry Consult   Reason for Consult:  SUicidal intent Referring Physician:  Dr. Hanley Ben Patient Identification: Heather Shepherd MRN:  [id number] Principal Diagnosis: Nausea and vomiting Diagnosis:  Principal Problem:   Nausea and vomiting Active Problems:   Tobacco abuse   Alcohol withdrawal (HCC)   Polysubstance use disorder   Lactic acidosis   Hypomagnesemia   Acute alcoholic hepatitis   Hypokalemia   Leukopenia   Normocytic anemia   Hypophosphatemia   Pseudohyponatremia   Prediabetes   Total Time spent with patient: 20 minutes  Subjective:   Heather Shepherd is a 32 y.o. female patient admitted with alcohol withdraw.   Heather Shepherd is a 32 y.o. Yo female opiate use disorder having trialed multiple substance use rehabilitation programs in the past, charted psychiatric diagnoses of bipolar disorder, depression, and SIMD with at least 1 past psychiatric hospitalization who presented to the ED on 07/26 for evaluation for alcohol withdrawal. Psychiatry was consulted due to report of suicidal ideation.  Heather Shepherd  reported suicidal ideation in the emergency room, telling the triage nurse that she had a plan to jump off the building.  She also reported that frustration with substance use prompted suicidal ideation. She reports daily intake of about 4-5 tall boys daily since her birthday. She, however has resides with her mother and gives her consent to speak with her.   Patient seen and assessed, disgruntled, uncooperative,, agitated.  In addition to verbal agitation she does present with psychomotor agitation, does sit up in bed and attempt to exit bed during evaluation.  She becomes very upset when asking for Ativan, despite receiving Ativan 10-15 minutes prior to psychiatric evaluation.  Patient remains on Precedex drip and 0.7, and phenobarbital taper, in addition to agitation protocol with Haldol/Ativan if she continues to display high levels of agitation  or aggression.  Patient continues to threaten to leave however has been placed under IVC due to current state, lack of capacity, danger to self and others at this time.  Patient refuses to answer any additional questions after Ativan was declined. "I just want fucking ativan. Im done talking."   HPI:  32 y.o. female with medical history significant of anxiety, depression, palpitations, dysmenorrhea, opioid dependence, polysubstance abuse, alcohol abuse, tobacco abuse presented with alcohol intoxication and withdrawal.  On presentation UDS was positive for cocaine; had elevated AST and ALT with normal lipase and total bilirubin.  CT of the head without contrast showed no acute intracranial abnormity.  CT of abdomen/pelvis with contrast showed hepatomegaly with diffuse hepatic steatosis and sequelae of chronic calcific pancreatitis.  She was started on CIWA protocol.  Subsequently, she was switched to phenobarbital taper.  PCCM was consulted.   Past Psychiatric History: Reports inpatient rehab in 2023; [facility] for alcohol. One isolated inpatient admission in 2017 for alcohol and depression.   Risk to Self:   Denies Risk to Others:   Denies Prior Inpatient Therapy:   Denies Prior Outpatient Therapy:   Denies  Past Medical History:  Past Medical History:  Diagnosis Date   Anxiety and depression 11/08/2011   Dysmenorrhea    Opioid dependence on agonist therapy (HCC)    Palpitations 01/22/2012   Polysubstance abuse (HCC)    Preventative health care 11/11/2011   Tobacco abuse 11/11/2011   History reviewed. No pertinent surgical history. Family History:  Family History  Problem Relation Age of Onset   Hyperlipidemia Mother    Hyperlipidemia Father    Diabetes Maternal Grandmother    Heart  disease Maternal Grandfather        triple bypass   Hyperlipidemia Maternal Grandfather    Hypertension Maternal Grandfather    Heart attack Maternal Grandfather    Family Psychiatric  History: Denies Social  History:  Social History   Substance and Sexual Activity  Alcohol Use Yes   Comment: occasionally     Social History   Substance and Sexual Activity  Drug Use No    Social History   Socioeconomic History   Marital status: Single    Spouse name: Not on file   Number of children: Not on file   Years of education: Not on file   Highest education level: Not on file  Occupational History   Not on file  Tobacco Use   Smoking status: Every Day    Current packs/day: 1.00    Average packs/day: 1 pack/day for 2.0 years (2.0 ttl pk-yrs)    Types: Cigarettes   Smokeless tobacco: Never  Substance and Sexual Activity   Alcohol use: Yes    Comment: occasionally   Drug use: No   Sexual activity: Yes    Partners: Male  Other Topics Concern   Not on file  Social History Narrative   Not on file   Social Determinants of Health   Financial Resource Strain: Low Risk  (10/13/2020)   Received from [facility]   Overall Financial Resource Strain (CARDIA)    Difficulty of Paying Living Expenses: Not hard at all  Food Insecurity: Food Insecurity Present (10/13/2022)   Hunger Vital Sign    Worried About Running Out of Food in the Last Year: Often true    Ran Out of Food in the Last Year: Often true  Transportation Needs: Unmet Transportation Needs (10/13/2022)   PRAPARE - Administrator, Civil Service (Medical): Yes    Lack of Transportation (Non-Medical): Yes  Physical Activity: Inactive (10/13/2020)   Received from [facility]   Exercise Vital Sign    Days of Exercise per Week: 0 days    Minutes of Exercise per Session: 0 min  Stress: No Stress Concern Present (10/13/2020)   Received from [facility] - Occupational Stress Questionnaire    Feeling of Stress : Not at all  Social Connections: Unknown (07/21/2021)   Received from [facility]   Social Network    Social Network: Not on file   Additional Social History:     Allergies:   Allergies  Allergen Reactions   Ibuprofen Hives    Labs:  Results for orders placed or performed during the hospital encounter of 10/12/22 (from the past 48 hour(s))  Glucose, capillary     Status: Abnormal   Collection Time: 10/15/22  4:25 PM  Result Value Ref Range   Glucose-Capillary 124 (H) 70 - 99 mg/dL    Comment: Glucose reference range applies only to samples taken after fasting for at least 8 hours.   Comment 1 Notify RN    Comment 2 Document in Chart   Glucose, capillary     Status: Abnormal   Collection Time: 10/15/22  8:32 PM  Result Value Ref Range   Glucose-Capillary 111 (H) 70 - 99 mg/dL    Comment: Glucose reference range applies only to samples taken after fasting for at least 8 hours.  Glucose, capillary     Status: Abnormal   Collection Time: 10/15/22 10:54 PM  Result Value Ref Range   Glucose-Capillary 118 (H) 70 -  99 mg/dL    Comment: Glucose reference range applies only to samples taken after fasting for at least 8 hours.  Comprehensive metabolic panel     Status: Abnormal   Collection Time: 10/16/22  3:18 AM  Result Value Ref Range   Sodium 140 135 - 145 mmol/L   Potassium 3.6 3.5 - 5.1 mmol/L   Chloride 106 98 - 111 mmol/L   CO2 21 (L) 22 - 32 mmol/L   Glucose, Bld 119 (H) 70 - 99 mg/dL    Comment: Glucose reference range applies only to samples taken after fasting for at least 8 hours.   BUN <5 (L) 6 - 20 mg/dL   Creatinine, Ser 5.28 0.44 - 1.00 mg/dL   Calcium 8.6 (L) 8.9 - 10.3 mg/dL   Total Protein 5.7 (L) 6.5 - 8.1 g/dL   Albumin 2.8 (L) 3.5 - 5.0 g/dL   AST 413 (H) 15 - 41 U/L   ALT 103 (H) 0 - 44 U/L   Alkaline Phosphatase 153 (H) 38 - 126 U/L   Total Bilirubin 2.0 (H) 0.3 - 1.2 mg/dL   GFR, Estimated >24 >40 mL/min    Comment: (NOTE) Calculated using the CKD-EPI Creatinine Equation (2021)    Anion gap 13 5 - 15    Comment: Performed at [facility], [address] [place], [place] [address]   Magnesium     Status: None   Collection Time: 10/16/22  3:18 AM  Result Value Ref Range   Magnesium 2.3 1.7 - 2.4 mg/dL    Comment: Performed at [facility], [address] [place], [place] [address]  Glucose, capillary     Status: Abnormal   Collection Time: 10/16/22  8:06 AM  Result Value Ref Range   Glucose-Capillary 125 (H) 70 - 99 mg/dL    Comment: Glucose reference range applies only to samples taken after fasting for at least 8 hours.   Comment 1 Notify RN    Comment 2 Document in Chart   Glucose, capillary     Status: Abnormal   Collection Time: 10/16/22 11:43 AM  Result Value Ref Range   Glucose-Capillary 114 (H) 70 - 99 mg/dL    Comment: Glucose reference range applies only to samples taken after fasting for at least 8 hours.   Comment 1 Notify RN    Comment 2 Document in Chart   Glucose, capillary     Status: Abnormal   Collection Time: 10/16/22  3:54 PM  Result Value Ref Range   Glucose-Capillary 106 (H) 70 - 99 mg/dL    Comment: Glucose reference range applies only to samples taken after fasting for at least 8 hours.   Comment 1 Notify RN    Comment 2 Document in Chart   Glucose, capillary     Status: Abnormal   Collection Time: 10/16/22  9:58 PM  Result Value Ref Range   Glucose-Capillary 121 (H) 70 - 99 mg/dL    Comment: Glucose reference range applies only to samples taken after fasting for at least 8 hours.  Glucose, capillary     Status: Abnormal   Collection Time: 10/17/22  7:33 AM  Result Value Ref Range   Glucose-Capillary 127 (H) 70 - 99 mg/dL    Comment: Glucose reference range applies only to samples taken after fasting for at least 8 hours.   Comment 1 Notify RN    Comment 2 Document in Chart   Basic metabolic panel     Status:  Abnormal   Collection Time: 10/17/22  7:38 AM  Result Value Ref Range   Sodium 137 135 - 145 mmol/L   Potassium 3.8 3.5 - 5.1 mmol/L    Comment: HEMOLYSIS AT THIS LEVEL MAY AFFECT RESULT   Chloride 104  98 - 111 mmol/L   CO2 16 (L) 22 - 32 mmol/L   Glucose, Bld 113 (H) 70 - 99 mg/dL    Comment: Glucose reference range applies only to samples taken after fasting for at least 8 hours.   BUN 6 6 - 20 mg/dL   Creatinine, Ser 6.57 0.44 - 1.00 mg/dL   Calcium 8.4 (L) 8.9 - 10.3 mg/dL   GFR, Estimated >84 >69 mL/min    Comment: (NOTE) Calculated using the CKD-EPI Creatinine Equation (2021)    Anion gap 17 (H) 5 - 15    Comment: Performed at [facility], [address] [place], [place] [address]  CBC with Differential/Platelet     Status: Abnormal   Collection Time: 10/17/22 10:28 AM  Result Value Ref Range   WBC 8.7 4.0 - 10.5 K/uL   RBC 3.53 (L) 3.87 - 5.11 MIL/uL   Hemoglobin 11.9 (L) 12.0 - 15.0 g/dL   HCT 84.1 (L) 32.4 - 40.1 %   MCV 101.1 (H) 80.0 - 100.0 fL   MCH 33.7 26.0 - 34.0 pg   MCHC 33.3 30.0 - 36.0 g/dL   RDW 02.7 (H) 25.3 - 66.4 %   Platelets 234 150 - 400 K/uL   nRBC 0.0 0.0 - 0.2 %   Neutrophils Relative % 59 %   Neutro Abs 5.2 1.7 - 7.7 K/uL   Lymphocytes Relative 28 %   Lymphs Abs 2.4 0.7 - 4.0 K/uL   Monocytes Relative 9 %   Monocytes Absolute 0.8 0.1 - 1.0 K/uL   Eosinophils Relative 2 %   Eosinophils Absolute 0.1 0.0 - 0.5 K/uL   Basophils Relative 1 %   Basophils Absolute 0.1 0.0 - 0.1 K/uL   Immature Granulocytes 1 %   Abs Immature Granulocytes 0.11 (H) 0.00 - 0.07 K/uL    Comment: Performed at [facility], [address], [place], [place] [address]  Hepatic function panel     Status: Abnormal   Collection Time: 10/17/22  1:01 PM  Result Value Ref Range   Total Protein 6.0 (L) 6.5 - 8.1 g/dL   Albumin 2.7 (L) 3.5 - 5.0 g/dL   AST 77 (H) 15 - 41 U/L   ALT 73 (H) 0 - 44 U/L   Alkaline Phosphatase 135 (H) 38 - 126 U/L   Total Bilirubin 2.3 (H) 0.3 - 1.2 mg/dL   Bilirubin, Direct 1.0 (H) 0.0 - 0.2 mg/dL   Indirect Bilirubin 1.3 (H) 0.3 - 0.9 mg/dL    Comment: Performed at [facility], [address] [place], [place] [address]  Beta-hydroxybutyric acid     Status: Abnormal   Collection Time: 10/17/22  1:01 PM  Result Value Ref Range   Beta-Hydroxybutyric Acid 3.15 (H) 0.05 - 0.27 mmol/L    Comment: Performed at [facility], [address], [place], [place] [address]  Glucose, capillary     Status: Abnormal   Collection Time: 10/17/22  1:07 PM  Result Value Ref Range   Glucose-Capillary 136 (H) 70 - 99 mg/dL    Comment: Glucose reference range applies only to samples taken after fasting for at least 8 hours.   Comment 1 Notify RN  Comment 2 Document in Chart     Current Facility-Administered Medications  Medication Dose Route Frequency Provider Last Rate Last Admin   acetaminophen (TYLENOL) tablet 650 mg  650 mg Oral Q6H PRN Bobette Mo, MD   650 mg at 10/16/22 2329   Or   acetaminophen (TYLENOL) suppository 650 mg  650 mg Rectal Q6H PRN Bobette Mo, MD       albuterol (PROVENTIL) (2.5 MG/3ML) 0.083% nebulizer solution 2.5 mg  2.5 mg Nebulization Q4H PRN Bobette Mo, MD       Chlorhexidine Gluconate Cloth 2 % PADS 6 each  6 each Topical Daily Bobette Mo, MD   6 each at 10/17/22 0755   dexmedetomidine (PRECEDEX) 200 MCG/50ML (4 mcg/mL) infusion  0-1.2 mcg/kg/hr Intravenous Titrated Luiz Iron, NP 18.6 mL/hr at 10/17/22 1506 0.8 mcg/kg/hr at 10/17/22 1506   folic acid (FOLVITE) tablet 1 mg  1 mg Oral Daily Elayne Snare K, DO   1 mg at 10/17/22 0945   haloperidol lactate (HALDOL) injection 5 mg  5 mg Intravenous Q4H PRN Conrad Roberts, MD   5 mg at 10/17/22 1212   heparin injection 5,000 Units  5,000 Units Subcutaneous Q8H Luciano Cutter, MD   5,000 Units at 10/17/22 1427   loperamide (IMODIUM) capsule 2 mg  2 mg Oral Q4H PRN Glade Lloyd, MD   2 mg at 10/17/22 1503   LORazepam (ATIVAN) injection 1 mg  1 mg Intravenous Q4H PRN Selmer Dominion B, NP   1 mg at 10/17/22 1454   multivitamin with minerals tablet 1  tablet  1 tablet Oral Daily Elayne Snare K, DO   1 tablet at 10/17/22 0945   nicotine (NICODERM CQ - dosed in mg/24 hours) patch 21 mg  21 mg Transdermal Daily Glade Lloyd, MD   21 mg at 10/17/22 0944   ondansetron (ZOFRAN) tablet 4 mg  4 mg Oral Q6H PRN Bobette Mo, MD       Or   ondansetron [facility]) injection 4 mg  4 mg Intravenous Q6H PRN Bobette Mo, MD   4 mg at 10/17/22 1425   Oral care mouth rinse  15 mL Mouth Rinse PRN Luciano Cutter, MD       pantoprazole (PROTONIX) EC tablet 40 mg  40 mg Oral BID Winfield Rast, RPH   40 mg at 10/17/22 1000   [START ON 10/19/2022] PHENobarbital (LUMINAL) tablet 32.4 mg  32.4 mg Oral BID Norton Blizzard, NP       [START ON 10/20/2022] PHENobarbital (LUMINAL) tablet 32.4 mg  32.4 mg Oral Daily Selmer Dominion B, NP       [START ON 10/18/2022] PHENobarbital (LUMINAL) tablet 64.8 mg  64.8 mg Oral BID Selmer Dominion B, NP       PHENobarbital (LUMINAL) tablet 97.2 mg  97.2 mg Oral BID Selmer Dominion B, NP   97.2 mg at 10/17/22 0945   [START ON 10/19/2022] thiamine (VITAMIN B1) tablet 100 mg  100 mg Oral Daily Maryagnes Amos, FNP       thiamine (VITAMIN B1) tablet 500 mg  500 mg Oral Daily Simonne Martinet, NP   500 mg at 10/17/22 0945    Musculoskeletal: Strength & Muscle Tone: decreased Gait & Station: unsteady Patient leans: Front            Psychiatric Specialty Exam:  Deferred due to lethargy    Physical Exam: Physical Exam Vitals and nursing note reviewed.  Constitutional:  General: She is sleeping.     Appearance: She is obese.     Interventions: Nasal cannula in place.     Comments: Attempted to see patient today, very sleepy.   Neurological:     General: No focal deficit present.     Mental Status: She is oriented to person, place, and time. Mental status is at baseline. She is lethargic.  Psychiatric:        Attention and Perception: She is attentive.        Mood and Affect: Mood is not  depressed.        Speech: Speech is not slurred.        Behavior: Behavior is uncooperative.        Thought Content: Thought content is not paranoid or delusional. Thought content does not include suicidal ideation. Thought content does not include suicidal plan.        Cognition and Memory: Memory is not impaired.        Judgment: Judgment is not impulsive or inappropriate.    Review of Systems  Psychiatric/Behavioral:  Positive for depression and substance abuse (ETOH and Suboxone). Negative for hallucinations, memory loss and suicidal ideas. The patient is nervous/anxious and has insomnia.   All other systems reviewed and are negative.  Blood pressure (!) 131/92, pulse 72, temperature 98.3 F (36.8 C), temperature source Oral, resp. rate 19, height 5\' 4"  (1.626 m), weight 93 kg, last menstrual period 09/28/2022, SpO2 98%. Body mass index is 35.19 kg/m.  Treatment Plan Summary: Daily contact with patient to assess and evaluate symptoms and progress in treatment, Medication management, and Plan     -Continue COWS and CIWA protocol.  Unable to start clonidine protocol as patient is currently bradycardic likely due to Precedex drip. -TOC referral for substance use resources and inpatient rehab.  -Continue safety sitter at this time. -Patient continues to meet criteria for IVC due to her safety, inability to participate in psych evaluation, and she continues to threaten to leave. Will place under IVC.  -Continue illicit substance use during hospital protocol. Continue with narcan prn. -Will continue IV thiamine 500mg  x 3 days.  -Continue phenobarbital taper, as needed Ativan, and Haldol.  -Will start gabapentin 200 mg p.o. 3 times daily to target cravings, withdrawal symptoms, agitation related to withdrawal. -Will start Haldol 5 mg p.o. twice daily for agitation related to alcohol use disorder.  May discontinue once patient is stable.  Consider currently stay 5 days, no response to  current medication regimen likely outside a window for acute withdrawal period (however there is a history of polysubstance abuse alcohol, cocaine, daily use of fentanyl and (.  Recommend EEG to rule out additional causes for agitation and medication non-response. (It should also be noted patient had dysconjugate gaze on most recent CT).  May benefit from repeat CT of head to assess for any additional organic causes of agitation.  Labs reviewed with multiple abnormalities CMP (liver transaminates, hypoalbuminemia), completely abnormal hepatic function panel, elevated beta hydroxybutyric acid.  Psych consult will continue to follow at this time.   Disposition:  Final disposition pending at this time.  Once medically stable will likely benefit from inpatient rehab, however full psychiatric evaluation has not yet been completed.   Maryagnes Amos, FNP 10/17/2022 4:22 PM

## 2022-10-18 ENCOUNTER — Encounter (HOSPITAL_COMMUNITY): Payer: Self-pay | Admitting: Internal Medicine

## 2022-10-18 ENCOUNTER — Inpatient Hospital Stay (HOSPITAL_COMMUNITY)
Admit: 2022-10-18 | Discharge: 2022-10-18 | Disposition: A | Discharge status: Home / Self Care | Payer: Self-pay | Attending: Nurse Practitioner | Admitting: Nurse Practitioner

## 2022-10-18 LAB — HEPATIC FUNCTION PANEL
ALT: 70 U/L — ABNORMAL HIGH (ref 0–44)
AST: 81 U/L — ABNORMAL HIGH (ref 15–41)
Albumin: 3 g/dL — ABNORMAL LOW (ref 3.5–5.0)
Alkaline Phosphatase: 137 U/L — ABNORMAL HIGH (ref 38–126)
Bilirubin, Direct: 0.8 mg/dL — ABNORMAL HIGH (ref 0.0–0.2)
Indirect Bilirubin: 0.8 mg/dL (ref 0.3–0.9)
Total Bilirubin: 1.6 mg/dL — ABNORMAL HIGH (ref 0.3–1.2)
Total Protein: 6.6 g/dL (ref 6.5–8.1)

## 2022-10-18 LAB — URINALYSIS, ROUTINE W REFLEX MICROSCOPIC
Bilirubin Urine: NEGATIVE
Glucose, UA: NEGATIVE mg/dL
Ketones, ur: NEGATIVE mg/dL
Nitrite: NEGATIVE
Protein, ur: NEGATIVE mg/dL
Specific Gravity, Urine: 1.002 — ABNORMAL LOW (ref 1.005–1.030)
pH: 7 (ref 5.0–8.0)

## 2022-10-18 LAB — GLUCOSE, CAPILLARY
Glucose-Capillary: 120 mg/dL — ABNORMAL HIGH (ref 70–99)
Glucose-Capillary: 109 mg/dL — ABNORMAL HIGH (ref 70–99)
Glucose-Capillary: 110 mg/dL — ABNORMAL HIGH (ref 70–99)
Glucose-Capillary: 122 mg/dL — ABNORMAL HIGH (ref 70–99)

## 2022-10-18 MED ORDER — LACTATED RINGERS IV BOLUS
1000.0000 mL | Freq: Once | INTRAVENOUS | Status: AC
  Administered 2022-10-18: 1000 mL via INTRAVENOUS

## 2022-10-18 MED ORDER — LORAZEPAM 1 MG PO TABS: 1.0000 mg | ORAL_TABLET | ORAL | Status: DC | PRN

## 2022-10-18 MED ORDER — LIDOCAINE 5 % EX PTCH
1.0000 | MEDICATED_PATCH | CUTANEOUS | Status: DC
  Administered 2022-10-18 – 2022-10-19 (×2): 1 via TRANSDERMAL
  Filled 2022-10-18 (×3): qty 1

## 2022-10-18 MED ORDER — LORAZEPAM 2 MG/ML IJ SOLN: 1.0000 mg | INTRAMUSCULAR | Status: DC | PRN

## 2022-10-18 MED ORDER — SODIUM CHLORIDE 0.9 % IV SOLN
1.0000 g | INTRAVENOUS | Status: DC
  Administered 2022-10-18 – 2022-10-19 (×2): 1 g via INTRAVENOUS
  Filled 2022-10-18 (×2): qty 10

## 2022-10-18 MED ORDER — OXYCODONE HCL 5 MG PO TABS
5.0000 mg | ORAL_TABLET | Freq: Four times a day (QID) | ORAL | Status: DC | PRN
  Administered 2022-10-18 – 2022-10-20 (×7): 5 mg via ORAL
  Filled 2022-10-18 (×7): qty 1

## 2022-10-18 MED ORDER — POTASSIUM CHLORIDE CRYS ER 20 MEQ PO TBCR
20.0000 meq | EXTENDED_RELEASE_TABLET | ORAL | Status: AC
  Administered 2022-10-18 (×2): 20 meq via ORAL
  Filled 2022-10-18 (×2): qty 1

## 2022-10-18 MED ORDER — FENTANYL CITRATE PF 50 MCG/ML IJ SOSY
50.0000 ug | PREFILLED_SYRINGE | Freq: Once | INTRAMUSCULAR | Status: AC
  Administered 2022-10-18: 50 ug via INTRAVENOUS
  Filled 2022-10-18: qty 1

## 2022-10-18 MED ORDER — POTASSIUM CHLORIDE 10 MEQ/100ML IV SOLN
10.0000 meq | INTRAVENOUS | Status: DC
  Administered 2022-10-18: 10 meq via INTRAVENOUS
  Filled 2022-10-18: qty 100

## 2022-10-18 NOTE — Progress Notes (Signed)
[facility] ADULT ICU REPLACEMENT PROTOCOL   The patient does apply for the [facility] Adult ICU Electrolyte Replacment Protocol based on the criteria listed below:   1.Exclusion criteria: TCTS, ECMO, Dialysis, and Myasthenia Gravis patients 2. Is GFR >/= 30 ml/min? Yes.    Patient's GFR today is >60 3. Is SCr </= 2? Yes.   Patient's SCr is 0.59 mg/dL 4. Did SCr increase >/= 0.5 in 24 hours? No. 5.Pt's weight >40kg  Yes.   6. Abnormal electrolyte(s): potassium 3.3   7. Electrolytes replaced per protocol 8.  Call MD STAT for K+ </= 2.5, Phos </= 1, or Mag </= 1 Physician:  protocol   Melvern Banker 10/18/2022 4:27 AM

## 2022-10-18 NOTE — Progress Notes (Signed)
  Notified of intermittent high HR, up to 120, was up to 150 reportedly while ambulating earlier, with exertion or when she gets upset/ anxiety/ anxious.    Now with temp 100.8.  Has been tolerating some oral fluids but has had vomiting after eating.  No O2 desaturations.  Has facial flushing and intermittent diaphoresis with most recent CIWA 11.  One BM this morning.  Voided x 3 since since change, sitter reported was amber, turned more yellow.  Blood pressure (!) 147/82, pulse 96, temperature (!) 100.8 F (38.2 C), temperature source Oral, resp. rate (!) 23, height 5\' 4"  (1.626 m), weight 93 kg, last menstrual period 09/28/2022, SpO2 99%.   Prior to entering room, pt resting with eyes closed.  On entry and talking with pt, she becomes upset, complaining of pain everywhere, states she's had dysuria since admitted (normal UA on admit), and has a cough with white and yellow sputum.  Denies current nausea.   Anxious/ tearful appearing, face flushed, mm minimally moist, skin warm/ dry, lungs clear  - Suspect she is withdrawing from narcotics, +/- mildly dehydrated from insufficient PO intake w/ intermittent vomiting +/- developing infection.  Temp also could be related to withdrawal.  Suspected last narcotic use 7/28 (witnessed by hospital staff).  Aspiration remains in differential with vomiting, however lungs clear with normal O2 saturations.    P:  - 1L LR bolus - send UA - fentanyl 50 mcg x 1, followed by oxy IR 5mg  q6hr prn  - encouraged PO intake - zofran prn  - CBC/ PCT in am.  Hold on empiric abx for now.  If ongoing fever, will culture and start abx empirically.   - will need further opiate withdrawal treatment> pending further recs from psych, would benefit from inpt rehab placement.     Posey Boyer, MSN, NP, AG-ACNP-BC [facility] 10/18/2022, 5:48 PM  See Amion for pager If no response to pager , please call [phone] until 7pm After 7:00 pm call Elink   [phone]

## 2022-10-18 NOTE — Plan of Care (Signed)
  Problem: Education: Goal: Knowledge of General Education information will improve Description: Including pain rating scale, medication(s)/side effects and non-pharmacologic comfort measures Outcome: Progressing   Problem: Health Behavior/Discharge Planning: Goal: Ability to manage health-related needs will improve Outcome: Progressing   Problem: Clinical Measurements: Goal: Ability to maintain clinical measurements within normal limits will improve Outcome: Progressing Goal: Will remain free from infection Outcome: Progressing Goal: Diagnostic test results will improve Outcome: Progressing Goal: Respiratory complications will improve Outcome: Progressing Goal: Cardiovascular complication will be avoided Outcome: Progressing   Problem: Activity: Goal: Risk for activity intolerance will decrease Outcome: Progressing   Problem: Nutrition: Goal: Adequate nutrition will be maintained Outcome: Progressing   Problem: Elimination: Goal: Will not experience complications related to bowel motility Outcome: Progressing Goal: Will not experience complications related to urinary retention Outcome: Progressing   Problem: Pain Managment: Goal: General experience of comfort will improve Outcome: Progressing   Problem: Safety: Goal: Ability to remain free from injury will improve Outcome: Progressing   Problem: Skin Integrity: Goal: Risk for impaired skin integrity will decrease Outcome: Progressing   Problem: Education: Goal: Ability to describe self-care measures that may prevent or decrease complications (Diabetes Survival Skills Education) will improve Outcome: Progressing Goal: Individualized Educational Video(s) Outcome: Progressing   Problem: Fluid Volume: Goal: Ability to maintain a balanced intake and output will improve Outcome: Progressing   Problem: Metabolic: Goal: Ability to maintain appropriate glucose levels will improve Outcome: Progressing   Problem:  Nutritional: Goal: Maintenance of adequate nutrition will improve Outcome: Progressing Goal: Progress toward achieving an optimal weight will improve Outcome: Progressing   Problem: Skin Integrity: Goal: Risk for impaired skin integrity will decrease Outcome: Progressing   Problem: Tissue Perfusion: Goal: Adequacy of tissue perfusion will improve Outcome: Progressing   Problem: Safety: Goal: Non-violent Restraint(s) Outcome: Progressing   Problem: Coping: Goal: Level of anxiety will decrease Outcome: Not Progressing   Problem: Coping: Goal: Ability to adjust to condition or change in health will improve Outcome: Not Progressing   Problem: Health Behavior/Discharge Planning: Goal: Ability to identify and utilize available resources and services will improve Outcome: Not Progressing Goal: Ability to manage health-related needs will improve Outcome: Not Progressing

## 2022-10-18 NOTE — Progress Notes (Addendum)
0800 patient alert x4 able to make all needs known not compliant with hospital precautions seeking medications CIWA score 3 no tremors, sweating noted IV potassium running at this time, plan is to give PO to replace electrolytes. Patient has 1-1 sitter at bedside per precautions of suicide, patient reports not wanting to harm self and has no plan to harm self states she was just drunk when she made prior comments.    0900 Hr 120-150 with activity  1400 therapy walked patient HR 140s  1600 waiting on EEG to finish to transport patient  1640 waiting on security to transport patient

## 2022-10-18 NOTE — Procedures (Signed)
[facility]  Routine EEG Report  Video Performed: Performed  Demographics: Patient Name:   Heather Shepherd, Heather Shepherd  Date of Birth:   1990-10-16  Identification Number:   MRN - [id number]  Study Times:  Study Start Time:   10/18/2022 18:01:00 Study End Time:   10/18/2022 18:28:00  Duration:   27 minutes  Indication(s): Encephalopathy  Technical Summary:  This EEG was performed utilizing standard International 10-20 System of electrode placement. One channel electrocardiogram was monitored. Data were obtained and interpreted utilizing referential montage recording, with reformatting to longitudinal, transverse bipolar, and referential montages as necessary for interpretation.  State(s):       Awake   Activation Procedures: Hyperventilation: Not performed Photic Stimulation: Not performed   EEG Description:  During wakefulness, the background showed a continuous 9Hz  posterior dominant alpha rhythm which was fairly modulated, symmetrical and reactive to eye opening.  Drowsiness or sleep stage II was not reached.  There was no clinical event or button-pushed event noted.  Throughout the record, there was no epileptiform abnormality or lateralizing sign observed.  Intermittent electrode and movement artifacts were noted.  Impression:  This is a normal awake EEG. No epileptiform abnormality or lateralizing sign is observed.  Note that an absence of epileptiform abnormality does not exclude a diagnosis of epilepsy.    Dr Reece Levy      [facility] For Inpatient follow-up with [facility] physician please call [facility] [phone]. This is not an outpatient service. Post hospital discharge, please contact hospital directly.  Please do not communicate with [facility] physicians via secure chat. If you have any questions, Please contact [facility]. Please call or reconsult our service if there are any clinical  or diagnostic changes.
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
## 2022-10-18 NOTE — Plan of Care (Signed)
Problem: Education: Goal: Knowledge of General Education information will improve Description: Including pain rating scale, medication(s)/side effects and non-pharmacologic comfort measures 10/18/2022 1401 by Aretta Nip, RN Outcome: Not Progressing 10/18/2022 1400 by Aretta Nip, RN Outcome: Not Progressing   Problem: Health Behavior/Discharge Planning: Goal: Ability to manage health-related needs will improve 10/18/2022 1401 by Aretta Nip, RN Outcome: Not Progressing 10/18/2022 1400 by Aretta Nip, RN Outcome: Not Progressing   Problem: Clinical Measurements: Goal: Ability to maintain clinical measurements within normal limits will improve 10/18/2022 1401 by Aretta Nip, RN Outcome: Progressing 10/18/2022 1400 by Aretta Nip, RN Outcome: Progressing Goal: Will remain free from infection 10/18/2022 1401 by Aretta Nip, RN Outcome: Progressing 10/18/2022 1400 by Aretta Nip, RN Outcome: Progressing Goal: Diagnostic test results will improve 10/18/2022 1401 by Aretta Nip, RN Outcome: Progressing 10/18/2022 1400 by Aretta Nip, RN Outcome: Progressing Goal: Respiratory complications will improve 10/18/2022 1401 by Aretta Nip, RN Outcome: Progressing 10/18/2022 1400 by Aretta Nip, RN Outcome: Progressing Goal: Cardiovascular complication will be avoided 10/18/2022 1401 by Aretta Nip, RN Outcome: Not Progressing 10/18/2022 1400 by Aretta Nip, RN Outcome: Not Progressing Note: Increase in HR with any activity   Problem: Activity: Goal: Risk for activity intolerance will decrease 10/18/2022 1401 by Aretta Nip, RN Outcome: Progressing 10/18/2022 1400 by Aretta Nip, RN Outcome: Progressing   Problem: Nutrition: Goal: Adequate nutrition will be maintained 10/18/2022 1401 by Aretta Nip, RN Outcome: Progressing 10/18/2022 1400 by Aretta Nip, RN Outcome: Progressing   Problem: Coping: Goal: Level of anxiety will  decrease 10/18/2022 1401 by Aretta Nip, RN Outcome: Not Progressing 10/18/2022 1400 by Aretta Nip, RN Outcome: Not Progressing   Problem: Elimination: Goal: Will not experience complications related to bowel motility 10/18/2022 1401 by Aretta Nip, RN Outcome: Progressing 10/18/2022 1400 by Aretta Nip, RN Outcome: Progressing Goal: Will not experience complications related to urinary retention 10/18/2022 1401 by Aretta Nip, RN Outcome: Progressing 10/18/2022 1400 by Aretta Nip, RN Outcome: Progressing   Problem: Pain Managment: Goal: General experience of comfort will improve 10/18/2022 1401 by Aretta Nip, RN Outcome: Not Progressing 10/18/2022 1400 by Aretta Nip, RN Outcome: Not Progressing   Problem: Safety: Goal: Ability to remain free from injury will improve 10/18/2022 1401 by Aretta Nip, RN Outcome: Not Progressing 10/18/2022 1400 by Aretta Nip, RN Outcome: Not Progressing   Problem: Skin Integrity: Goal: Risk for impaired skin integrity will decrease 10/18/2022 1401 by Aretta Nip, RN Outcome: Progressing 10/18/2022 1400 by Aretta Nip, RN Outcome: Progressing   Problem: Education: Goal: Ability to describe self-care measures that may prevent or decrease complications (Diabetes Survival Skills Education) will improve 10/18/2022 1401 by Aretta Nip, RN Outcome: Progressing 10/18/2022 1400 by Aretta Nip, RN Outcome: Progressing Goal: Individualized Educational Video(s) 10/18/2022 1401 by Aretta Nip, RN Outcome: Progressing 10/18/2022 1400 by Aretta Nip, RN Outcome: Progressing   Problem: Coping: Goal: Ability to adjust to condition or change in health will improve 10/18/2022 1401 by Aretta Nip, RN Outcome: Not Progressing 10/18/2022 1400 by Aretta Nip, RN Outcome: Progressing   Problem: Fluid Volume: Goal: Ability to maintain a balanced intake and output will improve 10/18/2022 1401 by Aretta Nip,  RN Outcome: Progressing 10/18/2022 1400 by Aretta Nip, RN Outcome: Progressing   Problem: Health Behavior/Discharge Planning: Goal: Ability to identify and utilize available  resources and services will improve 10/18/2022 1401 by Aretta Nip, RN Outcome: Not Progressing 10/18/2022 1400 by Aretta Nip, RN Outcome: Progressing Goal: Ability to manage health-related needs will improve 10/18/2022 1401 by Aretta Nip, RN Outcome: Not Progressing 10/18/2022 1400 by Aretta Nip, RN Outcome: Progressing   Problem: Metabolic: Goal: Ability to maintain appropriate glucose levels will improve 10/18/2022 1401 by Aretta Nip, RN Outcome: Progressing 10/18/2022 1400 by Aretta Nip, RN Outcome: Progressing   Problem: Nutritional: Goal: Maintenance of adequate nutrition will improve 10/18/2022 1401 by Aretta Nip, RN Outcome: Progressing 10/18/2022 1400 by Aretta Nip, RN Outcome: Progressing Goal: Progress toward achieving an optimal weight will improve 10/18/2022 1401 by Aretta Nip, RN Outcome: Progressing 10/18/2022 1400 by Aretta Nip, RN Outcome: Progressing   Problem: Skin Integrity: Goal: Risk for impaired skin integrity will decrease 10/18/2022 1401 by Aretta Nip, RN Outcome: Progressing 10/18/2022 1400 by Aretta Nip, RN Outcome: Progressing   Problem: Tissue Perfusion: Goal: Adequacy of tissue perfusion will improve 10/18/2022 1401 by Aretta Nip, RN Outcome: Progressing 10/18/2022 1400 by Aretta Nip, RN Outcome: Progressing   Problem: Safety: Goal: Non-violent Restraint(s) Outcome: Not Applicable   Problem: Education: Goal: Knowledge of disease or condition will improve Outcome: Not Progressing Goal: Understanding of discharge needs will improve Outcome: Not Progressing   Problem: Health Behavior/Discharge Planning: Goal: Ability to identify changes in lifestyle to reduce recurrence of condition will improve Outcome: Not  Progressing Goal: Identification of resources available to assist in meeting health care needs will improve Outcome: Not Progressing   Problem: Physical Regulation: Goal: Complications related to the disease process, condition or treatment will be avoided or minimized Outcome: Not Progressing   Problem: Safety: Goal: Ability to remain free from injury will improve Outcome: Not Progressing Patient non compliant with safety measures HR increase with any activity able to make all needs known agreeable to go to rehab

## 2022-10-18 NOTE — Consult Note (Signed)
[facility] Face-to-Face Psychiatry Consult   Reason for Consult:  Suicidal intent Referring Physician:  Dr. Hanley Ben Patient Identification: Heather Shepherd MRN:  [id number] Principal Diagnosis: Polysubstance use disorder Diagnosis:  Principal Problem:   Polysubstance use disorder Active Problems:   Anxiety and depression   Tobacco abuse   Alcohol withdrawal (HCC)   Nausea and vomiting   Lactic acidosis   Hypomagnesemia   Acute alcoholic hepatitis   Hypokalemia   Leukopenia   Normocytic anemia   Hypophosphatemia   Pseudohyponatremia   Prediabetes   Total Time spent with patient: 20 minutes  Subjective:   Heather Shepherd is a 32 y.o. female patient admitted with alcohol withdraw.   Heather Shepherd  reported suicidal ideation in the emergency room, telling the triage nurse that she had a plan to jump off the building.  She also reported that frustration with substance use prompted suicidal ideation. She reports daily intake of about 4-5 tall boys daily since her birthday. She, however has resides with her mother and gives her consent to speak with her.   Patient with a previous history of polysubstance abuse, related mood disorder who presents voluntarily for alcohol evaluation and withdrawal. She reports daily use of alcohol and fentanyl at this time.  She reports to intermittent cocaine use and reports using IV fentanyl daily.  Denies any prior history of DT's or withdrawal seizures. No overt depressive symptoms, mania or psychosis witnessed at present. Denies any SI, HI or AVH at present. No prior history of self harm or SA and no prior inpatient admission for mental health issues.  Given all this, patient will benefit from inpatient residential rehab for stabilization.   During the course of the hospitalization patient progressed well and improved her clinically while on Phenobarb taper. No overt withdrawal symptoms. She is med focused and  is noted ambulating well. Educated on adverse  effects of sedating medications and early ambulation (fall risk) after being hospitalized for 6 days on sedating medication. During her stay she did present with overt psychotic symptoms witnessed. Thought process is linear and organized. Denied any SI, HI or AVH on multiple occasions through her stay when she was alert at times.  She has fair insight and motivation, she is focused on her sobriety.  At this time patient symptoms have been managed well with medications consistent with standard of care. Safety planning will be coordinated with treatment team as well as the patient and family.   At time of evaluation, patient is linear, direct, and organized; denies SI, HI, and AVH and is not currently significantly impaired, psychotic, or manic on exam. Detailed risk assessment is complete based on clinical exam and individual risk factors and acute suicide risk is low and acute violence risk is low. At this time, protective factors outweigh risk factors and patient is no longer consider a danger to self.   HPI:  32 y.o. female with medical history significant of anxiety, depression, palpitations, dysmenorrhea, opioid dependence, polysubstance abuse, alcohol abuse, tobacco abuse presented with alcohol intoxication and withdrawal.  On presentation UDS was positive for cocaine; had elevated AST and ALT with normal lipase and total bilirubin.  CT of the head without contrast showed no acute intracranial abnormity.  CT of abdomen/pelvis with contrast showed hepatomegaly with diffuse hepatic steatosis and sequelae of chronic calcific pancreatitis.  She was started on CIWA protocol.  Subsequently, she was switched to phenobarbital taper.  PCCM was consulted.   Past Psychiatric History: Reports inpatient rehab in 2023; [facility] for  alcohol. One isolated inpatient admission in 2017 for alcohol and depression.   Risk to Self:   Denies Risk to Others:   Denies Prior Inpatient Therapy:   Denies Prior Outpatient Therapy:    Denies  Past Medical History:  Past Medical History:  Diagnosis Date   Anxiety and depression 11/08/2011   Dysmenorrhea    Opioid dependence on agonist therapy (HCC)    Palpitations 01/22/2012   Polysubstance abuse (HCC)    Preventative health care 11/11/2011   Tobacco abuse 11/11/2011   History reviewed. No pertinent surgical history. Family History:  Family History  Problem Relation Age of Onset   Hyperlipidemia Mother    Hyperlipidemia Father    Diabetes Maternal Grandmother    Heart disease Maternal Grandfather        triple bypass   Hyperlipidemia Maternal Grandfather    Hypertension Maternal Grandfather    Heart attack Maternal Grandfather    Family Psychiatric  History: Denies Social History:  Social History   Substance and Sexual Activity  Alcohol Use Yes   Comment: occasionally     Social History   Substance and Sexual Activity  Drug Use No    Social History   Socioeconomic History   Marital status: Single    Spouse name: Not on file   Number of children: Not on file   Years of education: Not on file   Highest education level: Not on file  Occupational History   Not on file  Tobacco Use   Smoking status: Every Day    Current packs/day: 1.00    Average packs/day: 1 pack/day for 2.0 years (2.0 ttl pk-yrs)    Types: Cigarettes   Smokeless tobacco: Never  Substance and Sexual Activity   Alcohol use: Yes    Comment: occasionally   Drug use: No   Sexual activity: Yes    Partners: Male  Other Topics Concern   Not on file  Social History Narrative   Not on file   Social Determinants of Health   Financial Resource Strain: Low Risk  (10/13/2020)   Received from [facility]   Overall Financial Resource Strain (CARDIA)    Difficulty of Paying Living Expenses: Not hard at all  Food Insecurity: Food Insecurity Present (10/13/2022)   Hunger Vital Sign    Worried About Running Out of Food in the Last Year: Often true    Ran Out of Food in the Last Year:  Often true  Transportation Needs: Unmet Transportation Needs (10/13/2022)   PRAPARE - Administrator, Civil Service (Medical): Yes    Lack of Transportation (Non-Medical): Yes  Physical Activity: Inactive (10/13/2020)   [facility]   Exercise Vital Sign    Days of Exercise per Week: 0 days    Minutes of Exercise per Session: 0 min  Stress: No Stress Concern Present (10/13/2020)   Received from [facility] - Occupational Stress Questionnaire    Feeling of Stress : Not at all  Social Connections: Unknown (07/21/2021)   Received from [facility]   Social Network    Social Network: Not on file   Additional Social History:    Allergies:   Allergies  Allergen Reactions   Ibuprofen Hives    Labs:  Results for orders placed or performed during the hospital encounter of 10/12/22 (from the past 48 hour(s))  Glucose, capillary     Status: Abnormal   Collection Time: 10/16/22  9:58 PM  Result Value Ref Range   Glucose-Capillary 121 (H) 70 - 99 mg/dL    Comment: Glucose reference range applies only to samples taken after fasting for at least 8 hours.  Glucose, capillary     Status: Abnormal   Collection Time: 10/17/22  7:33 AM  Result Value Ref Range   Glucose-Capillary 127 (H) 70 - 99 mg/dL    Comment: Glucose reference range applies only to samples taken after fasting for at least 8 hours.   Comment 1 Notify RN    Comment 2 Document in Chart   Basic metabolic panel     Status: Abnormal   Collection Time: 10/17/22  7:38 AM  Result Value Ref Range   Sodium 137 135 - 145 mmol/L   Potassium 3.8 3.5 - 5.1 mmol/L    Comment: HEMOLYSIS AT THIS LEVEL MAY AFFECT RESULT   Chloride 104 98 - 111 mmol/L   CO2 16 (L) 22 - 32 mmol/L   Glucose, Bld 113 (H) 70 - 99 mg/dL    Comment: Glucose reference range applies only to samples taken after fasting for at least 8 hours.   BUN 6 6 - 20 mg/dL   Creatinine, Ser 6.29 0.44 -  1.00 mg/dL   Calcium 8.4 (L) 8.9 - 10.3 mg/dL   GFR, Estimated >52 >84 mL/min    Comment: (NOTE) Calculated using the CKD-EPI Creatinine Equation (2021)    Anion gap 17 (H) 5 - 15    Comment: Performed at [facility], [address], [place], [place] [address]  CBC with Differential/Platelet     Status: Abnormal   Collection Time: 10/17/22 10:28 AM  Result Value Ref Range   WBC 8.7 4.0 - 10.5 K/uL   RBC 3.53 (L) 3.87 - 5.11 MIL/uL   Hemoglobin 11.9 (L) 12.0 - 15.0 g/dL   HCT 01.0 (L) 27.2 - 53.6 %   MCV 101.1 (H) 80.0 - 100.0 fL   MCH 33.7 26.0 - 34.0 pg   MCHC 33.3 30.0 - 36.0 g/dL   RDW 64.4 (H) 03.4 - 74.2 %   Platelets 234 150 - 400 K/uL   nRBC 0.0 0.0 - 0.2 %   Neutrophils Relative % 59 %   Neutro Abs 5.2 1.7 - 7.7 K/uL   Lymphocytes Relative 28 %   Lymphs Abs 2.4 0.7 - 4.0 K/uL   Monocytes Relative 9 %   Monocytes Absolute 0.8 0.1 - 1.0 K/uL   Eosinophils Relative 2 %   Eosinophils Absolute 0.1 0.0 - 0.5 K/uL   Basophils Relative 1 %   Basophils Absolute 0.1 0.0 - 0.1 K/uL   Immature Granulocytes 1 %   Abs Immature Granulocytes 0.11 (H) 0.00 - 0.07 K/uL    Comment: Performed at [facility], [address], [place], [place] [address]  Hepatic function panel     Status: Abnormal   Collection Time: 10/17/22  1:01 PM  Result Value Ref Range   Total Protein 6.0 (L) 6.5 - 8.1 g/dL   Albumin 2.7 (L) 3.5 - 5.0 g/dL   AST 77 (H) 15 - 41 U/L   ALT 73 (H) 0 - 44 U/L   Alkaline Phosphatase 135 (H) 38 - 126 U/L   Total Bilirubin 2.3 (H) 0.3 - 1.2 mg/dL   Bilirubin, Direct 1.0 (H) 0.0 - 0.2 mg/dL   Indirect Bilirubin 1.3 (H) 0.3 - 0.9 mg/dL    Comment: Performed at [facility], [address] [place], [place] [address]  Beta-hydroxybutyric acid     Status: Abnormal   Collection Time: 10/17/22  1:01 PM  Result Value Ref Range   Beta-Hydroxybutyric Acid 3.15 (H) 0.05 - 0.27 mmol/L    Comment: Performed at [facility], [address], [place], [place] [address]  Glucose, capillary     Status: Abnormal   Collection Time: 10/17/22  1:07 PM  Result Value Ref Range   Glucose-Capillary 136 (H) 70 - 99 mg/dL    Comment: Glucose reference range applies only to samples taken after fasting for at least 8 hours.   Comment 1 Notify RN    Comment 2 Document in Chart   Glucose, capillary     Status: Abnormal   Collection Time: 10/17/22  4:23 PM  Result Value Ref Range   Glucose-Capillary 129 (H) 70 - 99 mg/dL    Comment: Glucose reference range applies only to samples taken after fasting for at least 8 hours.   Comment 1 Notify RN    Comment 2 Document in Chart   Lactic acid, plasma     Status: None   Collection Time: 10/17/22  4:26 PM  Result Value Ref Range   Lactic Acid, Venous 1.1 0.5 - 1.9 mmol/L    Comment: Performed at [facility], [address], [place], [place] [address]  Blood gas, venous     Status: Abnormal   Collection Time: 10/17/22  4:26 PM  Result Value Ref Range   pH, Ven 7.47 (H) 7.25 - 7.43   pCO2, Ven 36 (L) 44 - 60 mmHg   pO2, Ven 62 (H) 32 - 45 mmHg   Bicarbonate 26.2 20.0 - 28.0 mmol/L   Acid-Base Excess 2.7 (H) 0.0 - 2.0 mmol/L   O2 Saturation 92.6 %   Patient temperature 36.9     Comment: Performed at [facility], [address] [place], [place] [address]  Glucose, capillary     Status: Abnormal   Collection Time: 10/17/22  9:12 PM  Result Value Ref Range   Glucose-Capillary 126 (H) 70 - 99 mg/dL    Comment: Glucose reference range applies only to samples taken after fasting for at least 8 hours.  Basic metabolic panel     Status: Abnormal   Collection Time: 10/18/22  3:23 AM  Result Value Ref Range   Sodium 136 135 - 145 mmol/L   Potassium 3.3 (L) 3.5 - 5.1 mmol/L   Chloride 102 98 - 111 mmol/L   CO2 22 22 - 32 mmol/L   Glucose, Bld 102 (H) 70 - 99 mg/dL    Comment: Glucose reference range applies only to samples  taken after fasting for at least 8 hours.   BUN 5 (L) 6 - 20 mg/dL   Creatinine, Ser 1.02 0.44 - 1.00 mg/dL   Calcium 8.2 (L) 8.9 - 10.3 mg/dL   GFR, Estimated >72 >53 mL/min    Comment: (NOTE) Calculated using the CKD-EPI Creatinine Equation (2021)    Anion gap 12 5 - 15    Comment: Performed at [facility], [address] [place], [place] [address]  Folate     Status: None   Collection Time: 10/18/22  3:23 AM  Result Value Ref Range   Folate 13.7 >5.9 ng/mL    Comment: Performed at [facility], [address], [place], [place] [address]  Vitamin B12     Status: Abnormal   Collection Time: 10/18/22  3:23 AM  Result Value  Ref Range   Vitamin B-12 2,328 (H) 180 - 914 pg/mL    Comment: RESULT CONFIRMED BY MANUAL DILUTION (NOTE) This assay is not validated for testing neonatal or myeloproliferative syndrome specimens for Vitamin B12 levels. Performed at [facility], [address] [place], [place] [address]   TSH     Status: None   Collection Time: 10/18/22  3:23 AM  Result Value Ref Range   TSH 3.778 0.350 - 4.500 uIU/mL    Comment: Performed by a 3rd Generation assay with a functional sensitivity of <=0.01 uIU/mL. Performed at [facility], [address] [place], [place] [address]   Glucose, capillary     Status: Abnormal   Collection Time: 10/18/22  7:09 AM  Result Value Ref Range   Glucose-Capillary 120 (H) 70 - 99 mg/dL    Comment: Glucose reference range applies only to samples taken after fasting for at least 8 hours.  Hepatic function panel     Status: Abnormal   Collection Time: 10/18/22  8:09 AM  Result Value Ref Range   Total Protein 6.6 6.5 - 8.1 g/dL   Albumin 3.0 (L) 3.5 - 5.0 g/dL   AST 81 (H) 15 - 41 U/L   ALT 70 (H) 0 - 44 U/L   Alkaline Phosphatase 137 (H) 38 - 126 U/L   Total Bilirubin 1.6 (H) 0.3 - 1.2 mg/dL   Bilirubin, Direct 0.8 (H) 0.0 - 0.2 mg/dL   Indirect  Bilirubin 0.8 0.3 - 0.9 mg/dL    Comment: Performed at [facility], [address], [place], [place] [address]  Glucose, capillary     Status: Abnormal   Collection Time: 10/18/22 11:33 AM  Result Value Ref Range   Glucose-Capillary 109 (H) 70 - 99 mg/dL    Comment: Glucose reference range applies only to samples taken after fasting for at least 8 hours.    Current Facility-Administered Medications  Medication Dose Route Frequency Provider Last Rate Last Admin   acetaminophen (TYLENOL) tablet 650 mg  650 mg Oral Q6H PRN Bobette Mo, MD   650 mg at 10/18/22 1703   Or   acetaminophen (TYLENOL) suppository 650 mg  650 mg Rectal Q6H PRN Bobette Mo, MD       albuterol (PROVENTIL) (2.5 MG/3ML) 0.083% nebulizer solution 2.5 mg  2.5 mg Nebulization Q4H PRN Bobette Mo, MD       Chlorhexidine Gluconate Cloth 2 % PADS 6 each  6 each Topical Daily Bobette Mo, MD   6 each at 10/18/22 1000   folic acid (FOLVITE) tablet 1 mg  1 mg Oral Daily Elayne Snare K, DO   1 mg at 10/18/22 0901   gabapentin (NEURONTIN) capsule 200 mg  200 mg Oral TID Maryagnes Amos, FNP   200 mg at 10/18/22 1500   haloperidol lactate (HALDOL) injection 5 mg  5 mg Intravenous Q4H PRN Paliwal, Eliezer Lofts, MD   5 mg at 10/17/22 2022   heparin injection 5,000 Units  5,000 Units Subcutaneous Q8H Luciano Cutter, MD   5,000 Units at 10/18/22 1458   lidocaine (LIDODERM) 5 % 1 patch  1 patch Transdermal Q24H Selmer Dominion B, NP   1 patch at 10/18/22 1000   loperamide (IMODIUM) capsule 2 mg  2 mg Oral Q4H PRN Glade Lloyd, MD   2 mg at 10/17/22 2108   melatonin tablet 3 mg  3 mg Oral QHS PRN Migdalia Dk, MD  3 mg at 10/17/22 2307   multivitamin with minerals tablet 1 tablet  1 tablet Oral Daily Rexford Maus, DO   1 tablet at 10/18/22 0901   nicotine (NICODERM CQ - dosed in mg/24 hours) patch 21 mg  21 mg Transdermal Daily Glade Lloyd, MD   21 mg at 10/18/22  0902   ondansetron (ZOFRAN) tablet 4 mg  4 mg Oral Q6H PRN Bobette Mo, MD   4 mg at 10/17/22 2108   Or   ondansetron [facility]) injection 4 mg  4 mg Intravenous Q6H PRN Bobette Mo, MD   4 mg at 10/18/22 1221   Oral care mouth rinse  15 mL Mouth Rinse PRN Luciano Cutter, MD       oxyCODONE (Oxy IR/ROXICODONE) immediate release tablet 5 mg  5 mg Oral Q6H PRN Selmer Dominion B, NP   5 mg at 10/18/22 1742   pantoprazole (PROTONIX) EC tablet 40 mg  40 mg Oral BID Winfield Rast, RPH   40 mg at 10/18/22 0901   [START ON 10/19/2022] PHENobarbital (LUMINAL) tablet 32.4 mg  32.4 mg Oral BID Norton Blizzard, NP       [START ON 10/20/2022] PHENobarbital (LUMINAL) tablet 32.4 mg  32.4 mg Oral Daily Selmer Dominion B, NP       PHENobarbital (LUMINAL) tablet 64.8 mg  64.8 mg Oral BID Selmer Dominion B, NP   64.8 mg at 10/18/22 0906   [START ON 10/19/2022] thiamine (VITAMIN B1) tablet 100 mg  100 mg Oral Daily Maryagnes Amos, FNP        Musculoskeletal: Strength & Muscle Tone: decreased Gait & Station: unsteady Patient leans: Front            Psychiatric Specialty Exam:  Deferred due to lethargy    Physical Exam: Physical Exam Vitals and nursing note reviewed.  Constitutional:      Appearance: Normal appearance. She is obese.     Interventions: Nasal cannula in place.     Comments: Attempted to see patient today, very sleepy.   Neurological:     General: No focal deficit present.     Mental Status: She is alert and oriented to person, place, and time. Mental status is at baseline.  Psychiatric:        Attention and Perception: Attention and perception normal. She is attentive.        Mood and Affect: Mood and affect normal. Mood is not depressed.        Speech: Speech normal. Speech is not slurred.        Behavior: Behavior normal. Behavior is cooperative.        Thought Content: Thought content normal. Thought content is not paranoid or delusional. Thought  content does not include suicidal ideation. Thought content does not include suicidal plan.        Cognition and Memory: Cognition and memory normal. Memory is not impaired.        Judgment: Judgment normal. Judgment is not impulsive or inappropriate.    Review of Systems  Psychiatric/Behavioral:  Positive for substance abuse (ETOH and Suboxone). Negative for depression, hallucinations, memory loss and suicidal ideas. The patient is nervous/anxious. The patient does not have insomnia.   All other systems reviewed and are negative.  Blood pressure (!) 147/82, pulse 96, temperature (!) 100.8 F (38.2 C), temperature source Oral, resp. rate (!) 23, height 5\' 4"  (1.626 m), weight 93 kg, last menstrual period 09/28/2022, SpO2 99%. Body mass index  is 35.19 kg/m.  Treatment Plan Summary: Daily contact with patient to assess and evaluate symptoms and progress in treatment, Medication management, and Plan     -Continue COWS and CIWA protocol.  Unable to start clonidine protocol as patient is currently bradycardic likely due to Precedex drip. -TOC referral for substance use resources and inpatient rehab.  -Continue safety sitter at this time. -Will likely dc IVC tomorrow following one more day of stability.  -Continue illicit substance use during hospital protocol. Continue with narcan prn. -Will start oral thiamine at this time.  -Continue phenobarbital taper, as needed Ativan, and Haldol.  -Will continue gabapentin 200 mg p.o. 3 times daily to target cravings, withdrawal symptoms, agitation related to withdrawal. -Will continue Haldol 5 mg p.o. twice daily for agitation related to alcohol use disorder.  May discontinue once patient is stable. -TOC referral for inpatient rehab. Prefer door to door for stability.   Labs reviewed with multiple abnormalities CMP (liver transaminates, hypoalbuminemia), completely abnormal hepatic function panel, elevated beta hydroxybutyric acid.  Psych consult will  continue to follow at this time.   Disposition:Inpatient rehab.   Maryagnes Amos, FNP 10/18/2022 5:56 PM

## 2022-10-18 NOTE — Progress Notes (Signed)
   10/18/22 1652  Assess: MEWS Score  Temp (!) 100.8 F (38.2 C)  BP (!) 147/82  MAP (mmHg) 101  Pulse Rate (!) 117  SpO2 99 %  O2 Device Room Air  Assess: MEWS Score  MEWS Temp 1  MEWS Systolic 0  MEWS Pulse 2  MEWS RR 1  MEWS LOC 0  MEWS Score 4  MEWS Score Color Red  Assess: if the MEWS score is Yellow or Red  Were vital signs accurate and taken at a resting state? Yes  Does the patient meet 2 or more of the SIRS criteria? Yes  Does the patient have a confirmed or suspected source of infection? No  MEWS guidelines implemented  No, previously red, continue vital signs every 4 hours  Notify: Charge Nurse/RN  Name of Charge Nurse/RN Notified Cindy RN  Provider Notification  Provider Name/Title [facility] Attending  Date Provider Notified 10/18/22  Time Provider Notified 1655  Method of Notification Page  Notification Reason Critical Result  Provider response At bedside  Date of Provider Response 10/18/22  Time of Provider Response 1700  Assess: SIRS CRITERIA  SIRS Temperature  0  SIRS Pulse 1  SIRS Respirations  1  SIRS WBC 0  SIRS Score Sum  2   Pt ST and Temp addressed.  Dr at [facility]. Executed new orders.

## 2022-10-18 NOTE — Progress Notes (Signed)
EEG complete - results pending. Pt pulled her leads out herself after the exam and would not let me finish cleaning the paste and prep out of her hair. She was being non-compliant in the end and was waiting for me to finish to be escorted to a new room and impatient. Tech was not able to complete her hair cleaning. No skin breakdown.

## 2022-10-19 ENCOUNTER — Inpatient Hospital Stay (HOSPITAL_COMMUNITY): Payer: Self-pay

## 2022-10-19 ENCOUNTER — Other Ambulatory Visit (HOSPITAL_COMMUNITY): Payer: Self-pay

## 2022-10-19 LAB — URINALYSIS, W/ REFLEX TO CULTURE (INFECTION SUSPECTED)
Bilirubin Urine: NEGATIVE
Glucose, UA: NEGATIVE mg/dL
Hgb urine dipstick: NEGATIVE
Ketones, ur: 5 mg/dL — AB
Leukocytes,Ua: NEGATIVE
Nitrite: NEGATIVE
Protein, ur: NEGATIVE mg/dL
Specific Gravity, Urine: 1.002 — ABNORMAL LOW (ref 1.005–1.030)
pH: 6 (ref 5.0–8.0)

## 2022-10-19 LAB — GLUCOSE, CAPILLARY
Glucose-Capillary: 107 mg/dL — ABNORMAL HIGH (ref 70–99)
Glucose-Capillary: 108 mg/dL — ABNORMAL HIGH (ref 70–99)
Glucose-Capillary: 97 mg/dL (ref 70–99)
Glucose-Capillary: 97 mg/dL (ref 70–99)

## 2022-10-19 LAB — PROCALCITONIN: Procalcitonin: 1.29 ng/mL

## 2022-10-19 MED ORDER — ACETAMINOPHEN-CAFFEINE 500-65 MG PO TABS: 1.0000 | ORAL_TABLET | Freq: Once | ORAL | Status: DC

## 2022-10-19 MED ORDER — ASPIRIN-ACETAMINOPHEN-CAFFEINE 250-250-65 MG PO TABS
2.0000 | ORAL_TABLET | Freq: Once | ORAL | Status: AC
  Administered 2022-10-19: 2 via ORAL
  Filled 2022-10-19: qty 2

## 2022-10-19 MED ORDER — SODIUM CHLORIDE 0.9 % IV SOLN
12.5000 mg | Freq: Four times a day (QID) | INTRAVENOUS | Status: DC | PRN
  Administered 2022-10-19: 12.5 mg via INTRAVENOUS
  Filled 2022-10-19: qty 12.5

## 2022-10-19 NOTE — TOC Progression Note (Addendum)
Transition of Care [facility]) - Progression Note    Patient Details  Name: Heather Shepherd MRN: [id number] Date of Birth: [birth year]  Transition of Care [facility] CM/SW Contact  Beckie Busing, RN Phone [phone]  10/19/2022, 3:13 PM  Clinical Narrative:    Patient will [facility] to [facility] tomorrow morning (10/20/22) and will need 21 day supply of Gabapentin. MATCH letter completed and placed on  chart. Secure chat has been sent to [facility] outpatient pharmacy. CM has requested that MD send script to [facility] outpt pharmacy.   Patient can be transported via safe transport. CM did not set up transport due MD states that patient may possibly be ready for discharge tomorrow. TOC following for disposition.        Expected Discharge Plan and Services                                               Social Determinants of Health (SDOH) Interventions SDOH Screenings   Food Insecurity: Food Insecurity Present (10/13/2022)  Housing: High Risk (10/13/2022)  Transportation Needs: Unmet Transportation Needs (10/13/2022)  Utilities: At Risk (10/13/2022)  Financial Resource Strain: Low Risk  (10/13/2020)   Received from [facility]  Physical Activity: Inactive (10/13/2020)   Received from [facility]  Social Connections: Unknown (07/21/2021)   Received from [facility]  Stress: No Stress Concern Present (10/13/2020)   Received from [facility]  Tobacco Use: High Risk (10/18/2022)    Readmission Risk Interventions     No data to display

## 2022-10-19 NOTE — Progress Notes (Signed)
PROGRESS NOTE    Heather Shepherd  UXL:[id number] DOB: January 19, 1991 DOA: 10/12/2022 PCP: Pcp, No   Brief Narrative: Heather Shepherd is a 32 y.o. female with a history of anxiety, depression, palpitations, dysmenorrhea, polysubstance abuse opioid dependence, tobacco abuse, alcohol abuse.  Patient presented secondary to concern for alcohol withdrawal and was acutely intoxicated.  Patient was started on management for alcohol withdrawal with worsening symptoms requiring admission to ICU for Precedex and phenobarbital with taper.   Assessment and Plan:  Acute toxic and metabolic encephalopathy Secondary to polysubstance abuse intoxication in addition to withdrawal symptoms. Resolved with treatment. Psychiatry evaluated and patient has been accepted to substance use disorder treatment center. -Continue gabapentin -Continue haldol  Alcohol withdrawal Severe with hallucinations. Patient was managed on Precedex with phenobarbital taper in the ICU. Precedex weaned off. -Continue phenobarbital taper  Fever Unclear etiology. Cough and mild dysuria per patient. Critical care medicine started Ceftriaxone; no cultures obtained. Chest x-ray and urinalysis obtained. -Continue Cetriaxone  Polysubstance abuse Patient with witnessed in-hospital drug use suspected to be fentanyl. Currently IVC'd with plan to discharge to a substance use disorder treatment center.  Suicidal ideation Patient IVC this admission.  Psychiatry was consulted and evaluated.  Patient managed on Haldol as needed.  Psychiatry plan to rescind IVC on 8/3 to allow patient to transfer to substance use disorder treatment center.  No need for inpatient psychiatry admission.  Elevated AST/ALT Elevated bilirubin CT imaging consistent with hepatomegaly and diffuse hepatic steatosis.  No evidence of cholecystitis or choledocholithiasis.  Repeat lab work as an outpatient.  Hepatic steatosis Hepatomegaly Likely related to alcohol abuse.   Patient will need to abstain from alcohol going forward.  Patient will likely need significant help with this task.  Recommend close outpatient PCP follow-up and consideration for referral to gastroenterology.  Oliguria Resolved.  Metabolic acidosis Resolved.  DVT prophylaxis: Heparin subq Code Status:   Code Status: Full Code Family Communication: None at bedside Disposition Plan: Discharge likely in    Consultants:  PCCM Psychiatry  Procedures:    Antimicrobials: Ceftriaxone    Subjective: Patient reports needing Xanax. She reports a headache that has improved.  Objective: BP (!) 134/98 (BP Location: Right Arm)   Pulse (!) 105   Temp 98.9 F (37.2 C) (Oral)   Resp 20   Ht 5\' 4"  (1.626 m)   Wt 93 kg   LMP 09/28/2022 (Approximate)   SpO2 98%   BMI 35.19 kg/m   Examination:  General exam: Appears calm and comfortable Respiratory system: Clear to auscultation. Respiratory effort normal. Cardiovascular system: S1 & S2 heard, RRR. No murmurs Gastrointestinal system: Abdomen is nondistended, soft and nontender. Normal bowel sounds heard. Central nervous system: Alert and oriented.  Data Reviewed: I have personally reviewed following labs and imaging studies  CBC Lab Results  Component Value Date   WBC 6.4 10/19/2022   RBC 3.18 (L) 10/19/2022   HGB 10.5 (L) 10/19/2022   HCT 32.4 (L) 10/19/2022   MCV 101.9 (H) 10/19/2022   MCH 33.0 10/19/2022   PLT 298 10/19/2022   MCHC 32.4 10/19/2022   RDW 20.5 (H) 10/19/2022   LYMPHSABS 2.4 10/17/2022   MONOABS 0.8 10/17/2022   EOSABS 0.1 10/17/2022   BASOSABS 0.1 10/17/2022     Last metabolic panel Lab Results  Component Value Date   NA 138 10/19/2022   K 3.3 (L) 10/19/2022   CL 105 10/19/2022   CO2 23 10/19/2022   BUN <5 (L) 10/19/2022   CREATININE 0.62  10/19/2022   GLUCOSE 98 10/19/2022   GFRNONAA >60 10/19/2022   CALCIUM 8.3 (L) 10/19/2022   PHOS 1.4 (L) 10/13/2022   PROT 6.6 10/21/22   ALBUMIN 3.0  (L) 2022-10-21   BILITOT 1.6 (H) 2022-10-21   ALKPHOS 137 (H) October 21, 2022   AST 81 (H) 2022-10-21   ALT 70 (H) October 21, 2022   ANIONGAP 10 10/19/2022    GFR: Estimated Creatinine Clearance: 113.6 mL/min (by C-G formula based on SCr of 0.62 mg/dL).  Recent Results (from the past 240 hour(s))  MRSA Next Gen by PCR, Nasal     Status: None   Collection Time: 10/13/22  4:09 PM   Specimen: Nasal Mucosa; Nasal Swab  Result Value Ref Range Status   MRSA by PCR Next Gen NOT DETECTED NOT DETECTED Final    Comment: (NOTE) The GeneXpert MRSA Assay (FDA approved for NASAL specimens only), is one component of a comprehensive MRSA colonization surveillance program. It is not intended to diagnose MRSA infection nor to guide or monitor treatment for MRSA infections. Test performance is not FDA approved in patients less than 66 years old. Performed at [facility], [address] [place], [place] [address]       Radiology Studies: EEG adult  Result Date: 2022-10-21 Reece Levy, MD     10-21-22  5:52 PM [facility] Routine EEG Report Video Performed: Performed Demographics: Patient Name:   Heather Shepherd Date of Birth:   1990-05-02 Identification Number:   MRN - [id number] Study Times: Study Start Time:   Oct 21, 2022 18:01:00 Study End Time:   Oct 21, 2022 18:28:00 Duration:   27 minutes Indication(s): Encephalopathy Technical Summary: This EEG was performed utilizing standard International 10-20 System of electrode placement. One channel electrocardiogram was monitored. Data were obtained and interpreted utilizing referential montage recording, with reformatting to longitudinal, transverse bipolar, and referential montages as necessary for interpretation. State(s):       Awake Activation Procedures: Hyperventilation: Not performed Photic Stimulation: Not performed EEG Description: During wakefulness, the background showed a  continuous 9Hz  posterior dominant alpha rhythm which was fairly modulated, symmetrical and reactive to eye opening.  Drowsiness or sleep stage II was not reached.  There was no clinical event or button-pushed event noted.  Throughout the record, there was no epileptiform abnormality or lateralizing sign observed.  Intermittent electrode and movement artifacts were noted. Impression: This is a normal awake EEG. No epileptiform abnormality or lateralizing sign is observed.  Note that an absence of epileptiform abnormality does not exclude a diagnosis of epilepsy. Dr Reece Levy [facility] For Inpatient follow-up with [facility] physician please call [facility] [phone]. This is not an outpatient service. Post hospital discharge, please contact hospital directly. Please do not communicate with [facility] physicians via secure chat. If you have any questions, Please contact [facility]. Please call or reconsult our service if there are any clinical or diagnostic changes.     LOS: 6 days    Jacquelin Hawking, MD [facility] 10/19/2022, 3:19 PM   If 7PM-7AM, please contact night-coverage [email or web address]

## 2022-10-19 NOTE — Progress Notes (Signed)
[employer] MEDICATION ASSISTANCE CARD Pharmacies please call [phone] for claim processing assistance.  Rx BIN: [id number] Rx Group: [id number] Rx PCN: [id number] Relationship Code: 1 Person Code: [id number]  Patient ID (MRN): [id number]    Patient Name: Heather Shepherd   Patient DOB:25-Jun-1990   Discharge Date:10/20/2022  Expiration Date:10/27/2022 (must be filled within 7 days of discharge)   Dear Heather Shepherd have been approved to have the prescriptions written by your discharging physician filled through our [facility] (Medication Assistance Through [facility]) program. This program allows for a one-time (no refills) 34-day supply of selected medications for a low copay amount.  The copay is $3.00 per prescription. For instance, if you have one prescription, you will pay $3.00; for two prescriptions, you pay $6.00; for three prescriptions, you pay $9.00; and so on. Only certain pharmacies are participating in this program with [facility]. You will need to select one of the pharmacies from the attached lists and take your prescriptions, this letter, and your photo ID to one of the participating pharmacies.  We are excited that you are able to use the [facility] program to get your medications. These prescriptions must be filled within 7 days of hospital discharge or they will no longer be valid for the [facility] program. Should you have any problems with your prescriptions please contact your case management team member at [phone] for Heather Shepherd or [phone] for [facility].  Thank you, [facility]

## 2022-10-19 NOTE — Consult Note (Signed)
[facility] Face-to-Face Psychiatry Consult   Reason for Consult:  Suicidal intent Referring Physician:  Dr. Hanley Ben Patient Identification: Clarita Mcelvain MRN:  [id number] Principal Diagnosis: Polysubstance use disorder Diagnosis:  Principal Problem:   Polysubstance use disorder Active Problems:   Anxiety and depression   Tobacco abuse   Alcohol withdrawal (HCC)   Nausea and vomiting   Lactic acidosis   Hypomagnesemia   Acute alcoholic hepatitis   Hypokalemia   Leukopenia   Normocytic anemia   Hypophosphatemia   Pseudohyponatremia   Prediabetes   Total Time spent with patient: 20 minutes  Subjective:   Destany Severns is a 32 y.o. female patient admitted with alcohol withdraw.   Errica Dutil  reported suicidal ideation in the emergency room, telling the triage nurse that she had a plan to jump off the building.  She also reported that frustration with substance use prompted suicidal ideation. She reports daily intake of about 4-5 tall boys daily since her birthday. She, however has resides with her mother and gives her consent to speak with her.   During evaluation Boneta Standre is seated at the edge of hospital bed. She is alert/oriented x 4; calm/cooperative; and mood congruent with affect.  Patient is speaking in a clear tone at moderate volume, and normal pace; with good eye contact.  Her thought process is coherent and relevant.  She does continue to fluctuate with mood swings, and tearful at times.  She cries briefly and then returns back to normal tone in conversation.  There is no indication that she is currently responding to internal/external stimuli or experiencing delusional thought content.  Patient denies suicidal/self-harm/homicidal ideation, psychosis, and paranoia.  Patient has remained calm throughout assessment and has answered questions appropriately.    In regards to the second half of a psychiatric consult for alcohol detox.  Readiness to change screening  completed.Patient reports having a positive experience and better outcomes for success with alcohol.  Patient is able to communicate effectively her goals for change, and wishing to start immediately as she does not wish to return to use of alcohol any longer.  She does understand it will require ongoing monitoring to identify relapse, barriers, and boundaries.  She is also open to inpatient rehab, as part of her plan to progress for change.  Patient appears to be in the action stage of change, and is ready to change her behavior for a long-term outcome.  She does appear to be competent with making changes at this time, and agrees to inpatient rehabilitation for polysubstance use.  She is able to verbalize that she no longer has intent to return to use of illicit substances upon discharge.  This nurse practitioner assisted patient to call [facility] for possible admission, as she was previously accepted on July 22.  Patient was accepted to this facility for tomorrow at 10 AM.  See below for further details and instructions.  At this time patient's condition that was previously worsening has improved, and suspect she is nearing her baseline.  She does have some obvious behaviors of jonesing and urges to use, which is why we will refer door-to-door transfer.    HPI:  32 y.o. female with medical history significant of anxiety, depression, palpitations, dysmenorrhea, opioid dependence, polysubstance abuse, alcohol abuse, tobacco abuse presented with alcohol intoxication and withdrawal.  On presentation UDS was positive for cocaine; had elevated AST and ALT with normal lipase and total bilirubin.  CT of the head without contrast showed no acute intracranial abnormity.  CT  of abdomen/pelvis with contrast showed hepatomegaly with diffuse hepatic steatosis and sequelae of chronic calcific pancreatitis.  She was started on CIWA protocol.  Subsequently, she was switched to phenobarbital taper.  PCCM was consulted.   Past  Psychiatric History: Reports inpatient rehab in 2023; [facility] for alcohol. One isolated inpatient admission in 2017 for alcohol and depression.   Risk to Self:   Denies Risk to Others:   Denies Prior Inpatient Therapy:   Denies Prior Outpatient Therapy:   Denies  Past Medical History:  Past Medical History:  Diagnosis Date   Anxiety and depression 11/08/2011   Dysmenorrhea    Opioid dependence on agonist therapy (HCC)    Palpitations 01/22/2012   Polysubstance abuse (HCC)    Preventative health care 11/11/2011   Tobacco abuse 11/11/2011   History reviewed. No pertinent surgical history. Family History:  Family History  Problem Relation Age of Onset   Hyperlipidemia Mother    Hyperlipidemia Father    Diabetes Maternal Grandmother    Heart disease Maternal Grandfather        triple bypass   Hyperlipidemia Maternal Grandfather    Hypertension Maternal Grandfather    Heart attack Maternal Grandfather    Family Psychiatric  History: Denies Social History:  Social History   Substance and Sexual Activity  Alcohol Use Yes   Comment: occasionally     Social History   Substance and Sexual Activity  Drug Use No    Social History   Socioeconomic History   Marital status: Single    Spouse name: Not on file   Number of children: Not on file   Years of education: Not on file   Highest education level: Not on file  Occupational History   Not on file  Tobacco Use   Smoking status: Every Day    Current packs/day: 1.00    Average packs/day: 1 pack/day for 2.0 years (2.0 ttl pk-yrs)    Types: Cigarettes   Smokeless tobacco: Never  Substance and Sexual Activity   Alcohol use: Yes    Comment: occasionally   Drug use: No   Sexual activity: Yes    Partners: Male  Other Topics Concern   Not on file  Social History Narrative   Not on file   Social Determinants of Health   Financial Resource Strain: Low Risk  (10/13/2020)   Received from [facility]   Overall Financial  Resource Strain (CARDIA)    Difficulty of Paying Living Expenses: Not hard at all  Food Insecurity: Food Insecurity Present (10/13/2022)   Hunger Vital Sign    Worried About Running Out of Food in the Last Year: Often true    Ran Out of Food in the Last Year: Often true  Transportation Needs: Unmet Transportation Needs (10/13/2022)   PRAPARE - Administrator, Civil Service (Medical): Yes    Lack of Transportation (Non-Medical): Yes  Physical Activity: Inactive (10/13/2020)   Received from [facility]   Exercise Vital Sign    Days of Exercise per Week: 0 days    Minutes of Exercise per Session: 0 min  Stress: No Stress Concern Present (10/13/2020)   Received from [facility] - Occupational Stress Questionnaire    Feeling of Stress : Not at all  Social Connections: Unknown (07/21/2021)   Received from [facility]   Social Network    Social Network: Not on file   Additional Social History:    Allergies:  Allergies  Allergen Reactions   Ibuprofen Hives    Labs:  Results for orders placed or performed during the hospital encounter of 10/12/22 (from the past 48 hour(s))  Glucose, capillary     Status: Abnormal   Collection Time: 10/17/22  4:23 PM  Result Value Ref Range   Glucose-Capillary 129 (H) 70 - 99 mg/dL    Comment: Glucose reference range applies only to samples taken after fasting for at least 8 hours.   Comment 1 Notify RN    Comment 2 Document in Chart   Lactic acid, plasma     Status: None   Collection Time: 10/17/22  4:26 PM  Result Value Ref Range   Lactic Acid, Venous 1.1 0.5 - 1.9 mmol/L    Comment: Performed at [facility], [address], [place], [place] [address]  Blood gas, venous     Status: Abnormal   Collection Time: 10/17/22  4:26 PM  Result Value Ref Range   pH, Ven 7.47 (H) 7.25 - 7.43   pCO2, Ven 36 (L) 44 - 60 mmHg   pO2, Ven 62 (H) 32 - 45 mmHg   Bicarbonate 26.2  20.0 - 28.0 mmol/L   Acid-Base Excess 2.7 (H) 0.0 - 2.0 mmol/L   O2 Saturation 92.6 %   Patient temperature 36.9     Comment: Performed at [facility], [address] [place], [place] [address]  Glucose, capillary     Status: Abnormal   Collection Time: 10/17/22  9:12 PM  Result Value Ref Range   Glucose-Capillary 126 (H) 70 - 99 mg/dL    Comment: Glucose reference range applies only to samples taken after fasting for at least 8 hours.  Basic metabolic panel     Status: Abnormal   Collection Time: 10/18/22  3:23 AM  Result Value Ref Range   Sodium 136 135 - 145 mmol/L   Potassium 3.3 (L) 3.5 - 5.1 mmol/L   Chloride 102 98 - 111 mmol/L   CO2 22 22 - 32 mmol/L   Glucose, Bld 102 (H) 70 - 99 mg/dL    Comment: Glucose reference range applies only to samples taken after fasting for at least 8 hours.   BUN 5 (L) 6 - 20 mg/dL   Creatinine, Ser 0.98 0.44 - 1.00 mg/dL   Calcium 8.2 (L) 8.9 - 10.3 mg/dL   GFR, Estimated >11 >91 mL/min    Comment: (NOTE) Calculated using the CKD-EPI Creatinine Equation (2021)    Anion gap 12 5 - 15    Comment: Performed at [facility], [address], [place], [place] [address]  Folate     Status: None   Collection Time: 10/18/22  3:23 AM  Result Value Ref Range   Folate 13.7 >5.9 ng/mL    Comment: Performed at [facility], [address], [place], [place] [address]  Vitamin B12     Status: Abnormal   Collection Time: 10/18/22  3:23 AM  Result Value Ref Range   Vitamin B-12 2,328 (H) 180 - 914 pg/mL    Comment: RESULT CONFIRMED BY MANUAL DILUTION (NOTE) This assay is not validated for testing neonatal or myeloproliferative syndrome specimens for Vitamin B12 levels. Performed at [facility], [address] [place], [place] [address]   TSH     Status: None   Collection Time: 10/18/22  3:23 AM  Result Value Ref Range   TSH 3.778 0.350 - 4.500 uIU/mL  Comment:  Performed by a 3rd Generation assay with a functional sensitivity of <=0.01 uIU/mL. Performed at [facility], [address] [place], [place] [address]   Glucose, capillary     Status: Abnormal   Collection Time: 10/18/22  7:09 AM  Result Value Ref Range   Glucose-Capillary 120 (H) 70 - 99 mg/dL    Comment: Glucose reference range applies only to samples taken after fasting for at least 8 hours.  Hepatic function panel     Status: Abnormal   Collection Time: 10/18/22  8:09 AM  Result Value Ref Range   Total Protein 6.6 6.5 - 8.1 g/dL   Albumin 3.0 (L) 3.5 - 5.0 g/dL   AST 81 (H) 15 - 41 U/L   ALT 70 (H) 0 - 44 U/L   Alkaline Phosphatase 137 (H) 38 - 126 U/L   Total Bilirubin 1.6 (H) 0.3 - 1.2 mg/dL   Bilirubin, Direct 0.8 (H) 0.0 - 0.2 mg/dL   Indirect Bilirubin 0.8 0.3 - 0.9 mg/dL    Comment: Performed at [facility], [address], [place], [place] [address]  Glucose, capillary     Status: Abnormal   Collection Time: 10/18/22 11:33 AM  Result Value Ref Range   Glucose-Capillary 109 (H) 70 - 99 mg/dL    Comment: Glucose reference range applies only to samples taken after fasting for at least 8 hours.  Urinalysis, Routine w reflex microscopic -Urine, Clean Catch     Status: Abnormal   Collection Time: 10/18/22  5:29 PM  Result Value Ref Range   Color, Urine YELLOW YELLOW   APPearance CLEAR CLEAR   Specific Gravity, Urine 1.002 (L) 1.005 - 1.030   pH 7.0 5.0 - 8.0   Glucose, UA NEGATIVE NEGATIVE mg/dL   Hgb urine dipstick MODERATE (A) NEGATIVE   Bilirubin Urine NEGATIVE NEGATIVE   Ketones, ur NEGATIVE NEGATIVE mg/dL   Protein, ur NEGATIVE NEGATIVE mg/dL   Nitrite NEGATIVE NEGATIVE   Leukocytes,Ua MODERATE (A) NEGATIVE   RBC / HPF 0-5 0 - 5 RBC/hpf   WBC, UA 21-50 0 - 5 WBC/hpf   Bacteria, UA RARE (A) NONE SEEN   Squamous Epithelial / HPF 0-5 0 - 5 /HPF    Comment: Performed at [facility], [address]  [place], [place] [address]  Glucose, capillary     Status: Abnormal   Collection Time: 10/18/22  6:00 PM  Result Value Ref Range   Glucose-Capillary 110 (H) 70 - 99 mg/dL    Comment: Glucose reference range applies only to samples taken after fasting for at least 8 hours.  Glucose, capillary     Status: Abnormal   Collection Time: 10/18/22  8:44 PM  Result Value Ref Range   Glucose-Capillary 122 (H) 70 - 99 mg/dL    Comment: Glucose reference range applies only to samples taken after fasting for at least 8 hours.  Procalcitonin     Status: None   Collection Time: 10/19/22  5:20 AM  Result Value Ref Range   Procalcitonin 1.29 ng/mL    Comment:        Interpretation: PCT > 0.5 ng/mL and <= 2 ng/mL: Systemic infection (sepsis) is possible, but other conditions are known to elevate PCT as well. (NOTE)       Sepsis PCT Algorithm           Lower Respiratory Tract  Infection PCT Algorithm    ----------------------------     ----------------------------         PCT < 0.25 ng/mL                PCT < 0.10 ng/mL          Strongly encourage             Strongly discourage   discontinuation of antibiotics    initiation of antibiotics    ----------------------------     -----------------------------       PCT 0.25 - 0.50 ng/mL            PCT 0.10 - 0.25 ng/mL               OR       >80% decrease in PCT            Discourage initiation of                                            antibiotics      Encourage discontinuation           of antibiotics    ----------------------------     -----------------------------         PCT >= 0.50 ng/mL              PCT 0.26 - 0.50 ng/mL                AND       <80% decrease in PCT             Encourage initiation of                                             antibiotics       Encourage continuation           of antibiotics    ----------------------------     -----------------------------        PCT >= 0.50 ng/mL                   PCT > 0.50 ng/mL               AND         increase in PCT                  Strongly encourage                                      initiation of antibiotics    Strongly encourage escalation           of antibiotics                                     -----------------------------                                           PCT <= 0.25 ng/mL  OR                                        > 80% decrease in PCT                                      Discontinue / Do not initiate                                             antibiotics  Performed at [facility], [address], [place], [place] [address]   Basic metabolic panel     Status: Abnormal   Collection Time: 10/19/22  5:21 AM  Result Value Ref Range   Sodium 138 135 - 145 mmol/L   Potassium 3.3 (L) 3.5 - 5.1 mmol/L   Chloride 105 98 - 111 mmol/L   CO2 23 22 - 32 mmol/L   Glucose, Bld 98 70 - 99 mg/dL    Comment: Glucose reference range applies only to samples taken after fasting for at least 8 hours.   BUN <5 (L) 6 - 20 mg/dL   Creatinine, Ser 4.13 0.44 - 1.00 mg/dL   Calcium 8.3 (L) 8.9 - 10.3 mg/dL   GFR, Estimated >24 >40 mL/min    Comment: (NOTE) Calculated using the CKD-EPI Creatinine Equation (2021)    Anion gap 10 5 - 15    Comment: Performed at [facility], [address] [place], [place] [address]  Magnesium     Status: None   Collection Time: 10/19/22  5:21 AM  Result Value Ref Range   Magnesium 1.9 1.7 - 2.4 mg/dL    Comment: Performed at [facility], [address], [place], [place] [address]  CBC     Status: Abnormal   Collection Time: 10/19/22  5:21 AM  Result Value Ref Range   WBC 6.4 4.0 - 10.5 K/uL   RBC 3.18 (L) 3.87 - 5.11 MIL/uL   Hemoglobin 10.5 (L) 12.0 - 15.0 g/dL   HCT 40.3 (L) 47.4 - 25.9 %   MCV 101.9 (H) 80.0 - 100.0 fL   MCH 33.0 26.0 - 34.0 pg   MCHC 32.4 30.0 - 36.0 g/dL   RDW  56.3 (H) 87.5 - 15.5 %   Platelets 298 150 - 400 K/uL   nRBC 0.0 0.0 - 0.2 %    Comment: Performed at [facility], [address] [place], [place] [address]  Glucose, capillary     Status: Abnormal   Collection Time: 10/19/22  7:26 AM  Result Value Ref Range   Glucose-Capillary 107 (H) 70 - 99 mg/dL    Comment: Glucose reference range applies only to samples taken after fasting for at least 8 hours.  Glucose, capillary     Status: Abnormal   Collection Time: 10/19/22 11:56 AM  Result Value Ref Range   Glucose-Capillary 108 (H) 70 - 99 mg/dL    Comment: Glucose reference range applies only to samples taken after fasting for at least 8 hours.    Current Facility-Administered Medications  Medication Dose Route Frequency Provider Last Rate Last Admin   acetaminophen (TYLENOL) tablet 650 mg  650 mg Oral Q6H  PRN Bobette Mo, MD   650 mg at 10/18/22 1703   Or   acetaminophen (TYLENOL) suppository 650 mg  650 mg Rectal Q6H PRN Bobette Mo, MD       albuterol (PROVENTIL) (2.5 MG/3ML) 0.083% nebulizer solution 2.5 mg  2.5 mg Nebulization Q4H PRN Bobette Mo, MD       cefTRIAXone (ROCEPHIN) 1 g in sodium chloride 0.9 % 100 mL IVPB  1 g Intravenous Q24H Selmer Dominion B, NP   Stopped at 10/18/22 2059   Chlorhexidine Gluconate Cloth 2 % PADS 6 each  6 each Topical Daily Bobette Mo, MD   6 each at 10/19/22 1204   folic acid (FOLVITE) tablet 1 mg  1 mg Oral Daily Elayne Snare K, DO   1 mg at 10/19/22 1610   gabapentin (NEURONTIN) capsule 200 mg  200 mg Oral TID Maryagnes Amos, FNP   200 mg at 10/19/22 0817   haloperidol lactate (HALDOL) injection 5 mg  5 mg Intravenous Q4H PRN Paliwal, Eliezer Lofts, MD   5 mg at 10/19/22 1212   heparin injection 5,000 Units  5,000 Units Subcutaneous Q8H Luciano Cutter, MD   5,000 Units at 10/19/22 1316   lidocaine (LIDODERM) 5 % 1 patch  1 patch Transdermal Q24H Selmer Dominion B, NP   1 patch at 10/19/22  0820   loperamide (IMODIUM) capsule 2 mg  2 mg Oral Q4H PRN Glade Lloyd, MD   2 mg at 10/17/22 2108   melatonin tablet 3 mg  3 mg Oral QHS PRN Migdalia Dk, MD   3 mg at 10/18/22 2118   multivitamin with minerals tablet 1 tablet  1 tablet Oral Daily Elayne Snare K, DO   1 tablet at 10/19/22 9604   nicotine (NICODERM CQ - dosed in mg/24 hours) patch 21 mg  21 mg Transdermal Daily Glade Lloyd, MD   21 mg at 10/19/22 0819   ondansetron (ZOFRAN) tablet 4 mg  4 mg Oral Q6H PRN Bobette Mo, MD   4 mg at 10/17/22 2108   Or   ondansetron (ZOFRAN) injection 4 mg  4 mg Intravenous Q6H PRN Bobette Mo, MD   4 mg at 10/18/22 2150   Oral care mouth rinse  15 mL Mouth Rinse PRN Luciano Cutter, MD       oxyCODONE (Oxy IR/ROXICODONE) immediate release tablet 5 mg  5 mg Oral Q6H PRN Selmer Dominion B, NP   5 mg at 10/19/22 1305   pantoprazole (PROTONIX) EC tablet 40 mg  40 mg Oral BID Winfield Rast, RPH   40 mg at 10/19/22 0817   PHENobarbital (LUMINAL) tablet 32.4 mg  32.4 mg Oral BID Selmer Dominion B, NP   32.4 mg at 10/19/22 0817   [START ON 10/20/2022] PHENobarbital (LUMINAL) tablet 32.4 mg  32.4 mg Oral Daily Selmer Dominion B, NP       promethazine (PHENERGAN) 12.5 mg in sodium chloride 0.9 % 50 mL IVPB  12.5 mg Intravenous Q6H PRN Narda Bonds, MD 200 mL/hr at 10/19/22 1203 12.5 mg at 10/19/22 1203   thiamine (VITAMIN B1) tablet 100 mg  100 mg Oral Daily Maryagnes Amos, FNP   100 mg at 10/19/22 5409    Musculoskeletal: Strength & Muscle Tone: decreased Gait & Station: unsteady Patient leans: Front            Psychiatric Specialty Exam:  Deferred due to lethargy    Physical Exam: Physical Exam  Vitals and nursing note reviewed.  Constitutional:      Appearance: Normal appearance. She is obese.     Interventions: Nasal cannula in place.     Comments: Attempted to see patient today, very sleepy.   Neurological:     General: No focal  deficit present.     Mental Status: She is alert and oriented to person, place, and time. Mental status is at baseline.  Psychiatric:        Attention and Perception: Attention and perception normal. She is attentive.        Mood and Affect: Mood normal. Mood is not depressed. Affect is tearful.        Speech: Speech normal. Speech is not slurred.        Behavior: Behavior normal. Behavior is cooperative.        Thought Content: Thought content normal. Thought content is not paranoid or delusional. Thought content does not include suicidal ideation. Thought content does not include suicidal plan.        Cognition and Memory: Cognition and memory normal. Memory is not impaired.        Judgment: Judgment is impulsive. Judgment is not inappropriate.    Review of Systems  Psychiatric/Behavioral:  Positive for substance abuse (ETOH and Suboxone). Negative for depression, hallucinations, memory loss and suicidal ideas. The patient is nervous/anxious. The patient does not have insomnia.   All other systems reviewed and are negative.  Blood pressure 118/80, pulse 93, temperature 98.9 F (37.2 C), temperature source Oral, resp. rate 15, height 5\' 4"  (1.626 m), weight 93 kg, last menstrual period 09/28/2022, SpO2 97%. Body mass index is 35.19 kg/m.  Treatment Plan Summary: Daily contact with patient to assess and evaluate symptoms and progress in treatment, Medication management, and Plan     -Continue safety sitter at this time. -Will dc IVC tomorrow.  IVC rescind form has been dated and placed in patient's chart. -Continue illicit substance use during hospital protocol. Continue with narcan prn. -Will continue oral thiamine at this time.  -Continue phenobarbital taper one additional dose remaining.  -Will continue gabapentin 200 mg p.o. 3 times daily to target cravings, withdrawal symptoms, agitation related to withdrawal. -Will continue Haldol 5 mg p.o. twice daily for agitation related to  alcohol use disorder.  May discontinue once patient is stable. Patient has been accepted to [place] [facility].  She will require a 21-day supply.  This communication has been discussed with the mother, and primary team to include Dr. Caleb Popp and Samson Frederic, LCSW.  Labs reviewed with multiple abnormalities CMP (liver transaminates, hypoalbuminemia), completely abnormal hepatic function panel, elevated beta hydroxybutyric acid.  Psych consult will sign off at this time.  Disposition:Inpatient rehab.   Maryagnes Amos, FNP 10/19/2022 2:07 PM

## 2022-10-19 NOTE — Hospital Course (Signed)
Heather Shepherd is a 33 y.o. female with a history of anxiety, depression, palpitations, dysmenorrhea, polysubstance abuse opioid dependence, tobacco abuse, alcohol abuse.  Patient presented secondary to concern for alcohol withdrawal and was acutely intoxicated.  Patient was started on management for alcohol withdrawal with worsening symptoms requiring admission to ICU for Precedex and phenobarbital with taper.

## 2022-10-20 ENCOUNTER — Other Ambulatory Visit (HOSPITAL_COMMUNITY): Payer: Self-pay

## 2022-10-20 LAB — GLUCOSE, CAPILLARY: Glucose-Capillary: 114 mg/dL — ABNORMAL HIGH (ref 70–99)

## 2022-10-20 MED ORDER — ADULT MULTIVITAMIN W/MINERALS CH: 1.0000 | ORAL_TABLET | Freq: Every day | ORAL | Status: AC

## 2022-10-20 MED ORDER — CEFPODOXIME PROXETIL 200 MG PO TABS: 200.0000 mg | ORAL_TABLET | Freq: Two times a day (BID) | ORAL | 0 refills | Status: AC

## 2022-10-20 MED ORDER — POTASSIUM CHLORIDE CRYS ER 20 MEQ PO TBCR
40.0000 meq | EXTENDED_RELEASE_TABLET | Freq: Once | ORAL | Status: AC
  Administered 2022-10-20: 40 meq via ORAL
  Filled 2022-10-20: qty 2

## 2022-10-20 MED ORDER — VITAMIN B-1 100 MG PO TABS: 100.0000 mg | ORAL_TABLET | Freq: Every day | ORAL | Status: AC

## 2022-10-20 MED ORDER — GABAPENTIN 100 MG PO CAPS: 200.0000 mg | ORAL_CAPSULE | Freq: Three times a day (TID) | ORAL | 0 refills | Status: AC

## 2022-10-20 MED ORDER — FOLIC ACID 1 MG PO TABS: 1.0000 mg | ORAL_TABLET | Freq: Every day | ORAL | Status: AC

## 2022-10-20 NOTE — Discharge Instructions (Addendum)
Heather Shepherd,  You were in the hospital because of confusion and withdrawal. You have improved with treatment. You may also have a UTI. Please continue your antibiotics as prescribed.

## 2022-10-20 NOTE — Discharge Summary (Signed)
Physician Discharge Summary   Patient: Heather Shepherd MRN: [id number] DOB: February 26, 1991  Admit date:     10/12/2022  Discharge date: 10/20/22  Discharge Physician: Jacquelin Hawking, MD   PCP: Pcp, No   Recommendations at discharge:  PCP follow-up Substance use disorder treatment center on discharge  Discharge Diagnoses: Principal Problem:   Polysubstance use disorder Active Problems:   Anxiety and depression   Tobacco abuse   Alcohol withdrawal (HCC)   Nausea and vomiting   Lactic acidosis   Hypomagnesemia   Acute alcoholic hepatitis   Hypokalemia   Leukopenia   Normocytic anemia   Hypophosphatemia   Pseudohyponatremia   Prediabetes  Resolved Problems:   * No resolved hospital problems. *  Hospital Course: [name] is a 32 y.o. female with a history of anxiety, depression, palpitations, dysmenorrhea, polysubstance abuse opioid dependence, tobacco abuse, alcohol abuse.  Patient presented secondary to concern for alcohol withdrawal and was acutely intoxicated.  Patient was started on management for alcohol withdrawal with worsening symptoms requiring admission to ICU for Precedex and phenobarbital with taper.  Patient discharged to substance use disorder treatment center.  Assessment and Plan:  Acute toxic and metabolic encephalopathy Secondary to polysubstance abuse intoxication in addition to withdrawal symptoms. Resolved with treatment. Psychiatry evaluated and patient has been accepted to substance use disorder treatment center.   Alcohol withdrawal Severe with hallucinations. Patient was managed on Precedex with phenobarbital taper in the ICU. Precedex weaned off.  Phenobarbital tapered off prior to discharge.   Complicated UTI Presumed diagnosis. Cough and dysuria per patient. Critical care medicine started Ceftriaxone; no cultures obtained. Chest x-ray and urinalysis obtained after initiation of antibiotics.  Urinalysis not suggestive of UTI but dysuria symptoms  improved with antibiotics.  Patient discharged to complete a 7-day course of antibiotic treatment for complicated UTI.  Patient discharged on Vantin.   Polysubstance abuse Patient with witnessed in-hospital drug use suspected to be fentanyl. Currently IVC'd with plan to discharge to a substance use disorder treatment center.  Patient discharged with gabapentin.   Suicidal ideation Patient IVC this admission.  Psychiatry was consulted and evaluated.  Patient managed on Haldol as needed.  No need for inpatient psychiatry admission per psychiatry recommendations.  IVC rescinded prior to discharge by psychiatry.   Elevated AST/ALT Elevated bilirubin CT imaging consistent with hepatomegaly and diffuse hepatic steatosis.  No evidence of cholecystitis or choledocholithiasis.  Repeat lab work as an outpatient.   Hepatic steatosis Hepatomegaly Likely related to alcohol abuse.  Patient will need to abstain from alcohol going forward.  Patient will likely need significant help with this task.  Recommend close outpatient PCP follow-up and consideration for referral to gastroenterology.  Hypokalemia Supplementation given for management.   Oliguria Resolved.   Metabolic acidosis Resolved.  Consultants: PCCM, psychiatry Procedures performed: None Disposition: Rehabilitation facility Diet recommendation: Regular diet   DISCHARGE MEDICATION: Allergies as of 10/20/2022       Reactions   Ibuprofen Hives        Medication List     TAKE these medications    cefpodoxime 200 MG tablet Commonly known as: VANTIN Take 1 tablet (200 mg total) by mouth 2 (two) times daily for 5 days.   folic acid 1 MG tablet Commonly known as: FOLVITE Take 1 tablet (1 mg total) by mouth daily.   gabapentin 100 MG capsule Commonly known as: NEURONTIN Take 2 capsules (200 mg total) by mouth 3 (three) times daily for 21 days.   multivitamin with minerals Tabs  tablet Take 1 tablet by mouth daily.   thiamine  100 MG tablet Commonly known as: Vitamin B-1 Take 1 tablet (100 mg total) by mouth daily.        Discharge Exam: BP 137/85 (BP Location: Right Arm)   Pulse (!) 107   Temp 98.5 F (36.9 C) (Oral)   Resp 18   Ht 5\' 4"  (1.626 m)   Wt 93 kg   LMP 09/28/2022 (Approximate)   SpO2 92%   BMI 35.19 kg/m   General exam: Appears calm and comfortable Respiratory system: Clear to auscultation. Respiratory effort normal. Cardiovascular system: S1 & S2 heard, RRR. No murmurs, rubs, gallops or clicks. Gastrointestinal system: Abdomen is nondistended, soft and nontender. Normal bowel sounds heard. Central nervous system: Alert and oriented. No focal neurological deficits. Musculoskeletal: No edema. No calf tenderness Skin: No cyanosis. No rashes Psychiatry: Judgement and insight appear normal. Mood & affect appropriate.   Condition at discharge: stable  The results of significant diagnostics from this hospitalization (including imaging, microbiology, ancillary and laboratory) are listed below for reference.   Imaging Studies: DG Chest 2 View  Result Date: 10/19/2022 CLINICAL DATA:  Productive cough, fever EXAM: CHEST - 2 VIEW COMPARISON:  01/13/2021 FINDINGS: There is poor inspiration. There are no signs of pulmonary edema or focal pulmonary consolidation. There is no pleural effusion or pneumothorax. IMPRESSION: There are no signs of pulmonary edema or focal pulmonary consolidation. Poor inspiration. Electronically Signed   By: Ernie Avena M.D.   On: 10/19/2022 16:30   EEG adult  Result Date: 10/18/2022 Reece Levy, MD     10/18/2022  5:52 PM [facility] Routine EEG Report Video Performed: Performed Demographics: Patient Name:   Heather Shepherd Date of Birth:   Apr 29, 1990 Identification Number:   MRN - [id number] Study Times: Study Start Time:   10/18/2022 18:01:00 Study End Time:   10/18/2022 18:28:00 Duration:   27 minutes  Indication(s): Encephalopathy Technical Summary: This EEG was performed utilizing standard International 10-20 System of electrode placement. One channel electrocardiogram was monitored. Data were obtained and interpreted utilizing referential montage recording, with reformatting to longitudinal, transverse bipolar, and referential montages as necessary for interpretation. State(s):       Awake Activation Procedures: Hyperventilation: Not performed Photic Stimulation: Not performed EEG Description: During wakefulness, the background showed a continuous 9Hz  posterior dominant alpha rhythm which was fairly modulated, symmetrical and reactive to eye opening.  Drowsiness or sleep stage II was not reached.  There was no clinical event or button-pushed event noted.  Throughout the record, there was no epileptiform abnormality or lateralizing sign observed.  Intermittent electrode and movement artifacts were noted. Impression: This is a normal awake EEG. No epileptiform abnormality or lateralizing sign is observed.  Note that an absence of epileptiform abnormality does not exclude a diagnosis of epilepsy. Dr Reece Levy [facility] For Inpatient follow-up with [facility] physician please call [facility] [phone]. This is not an outpatient service. Post hospital discharge, please contact hospital directly. Please do not communicate with [facility] physicians via secure chat. If you have any questions, Please contact [facility]. Please call or reconsult our service if there are any clinical or diagnostic changes.  CT ABDOMEN PELVIS W CONTRAST  Result Date: 10/12/2022 CLINICAL DATA:  Alcohol withdrawal, abdominal pain, nausea and vomiting, lactic acidosis, elevated LFTs EXAM: CT ABDOMEN AND PELVIS WITH CONTRAST TECHNIQUE: Multidetector CT imaging of the abdomen and pelvis was performed using the standard protocol following bolus administration of intravenous contrast. RADIATION DOSE  REDUCTION: This exam was  performed according to the departmental dose-optimization program which includes automated exposure control, adjustment of the mA and/or kV according to patient size and/or use of iterative reconstruction technique. CONTRAST:  80mL OMNIPAQUE IOHEXOL 300 MG/ML  SOLN COMPARISON:  02/21/2011 FINDINGS: Lower chest: No acute pleural or parenchymal lung disease. Hepatobiliary: Liver is markedly enlarged, with diffuse hepatic steatosis. No focal parenchymal liver abnormality or biliary duct dilation. The gallbladder is unremarkable. Pancreas: Pancreatic parenchymal atrophy, with parenchymal calcifications consistent with sequela of chronic calcific pancreatitis. No acute inflammatory changes or pancreatic duct dilation. Spleen: Normal in size without focal abnormality. Adrenals/Urinary Tract: Adrenal glands are unremarkable. Kidneys are normal, without renal calculi, focal lesion, or hydronephrosis. Bladder is unremarkable. Stomach/Bowel: No bowel obstruction or ileus. Normal appendix right lower quadrant. No bowel wall thickening or inflammatory change. Vascular/Lymphatic: No significant vascular findings are present. No enlarged abdominal or pelvic lymph nodes. Reproductive: Uterus and bilateral adnexa are unremarkable. Other: No free fluid or free intraperitoneal gas. Small fat containing umbilical hernia. No bowel herniation. Musculoskeletal: No acute or destructive bony abnormalities. Reconstructed images demonstrate no additional findings. IMPRESSION: 1. Hepatomegaly, with diffuse hepatic steatosis. 2. Sequela of chronic calcific pancreatitis. No acute inflammatory change. 3. No acute intra-abdominal or intrapelvic process. Electronically Signed   By: Sharlet Salina M.D.   On: 10/12/2022 15:47   CT Head Wo Contrast  Result Date: 10/12/2022 CLINICAL DATA:  Head trauma EXAM: CT HEAD WITHOUT CONTRAST TECHNIQUE: Contiguous axial images were obtained from the base of the skull through the vertex without intravenous  contrast. RADIATION DOSE REDUCTION: This exam was performed according to the departmental dose-optimization program which includes automated exposure control, adjustment of the mA and/or kV according to patient size and/or use of iterative reconstruction technique. COMPARISON:  CT head 01/17/2021 FINDINGS: Brain: There is no acute intracranial hemorrhage, extra-axial fluid collection, or acute infarct. Parenchymal volume is normal. The ventricles are normal in size. Gray-white differentiation is preserved The pituitary and suprasellar region are normal. There is no mass lesion. There is no mass effect or midline shift. Vascular: No hyperdense vessel or unexpected calcification. Skull: Normal. Negative for fracture or focal lesion. Sinuses/Orbits: The imaged paranasal sinuses are clear. There is a dysconjugate gaze. The globes and orbits are otherwise unremarkable. Other: The mastoid air cells and middle ear cavities are clear. IMPRESSION: Dysconjugate gaze.  Otherwise, unremarkable head CT. Electronically Signed   By: Lesia Hausen M.D.   On: 10/12/2022 08:22    Microbiology: Results for orders placed or performed during the hospital encounter of 10/12/22  MRSA Next Gen by PCR, Nasal     Status: None   Collection Time: 10/13/22  4:09 PM   Specimen: Nasal Mucosa; Nasal Swab  Result Value Ref Range Status   MRSA by PCR Next Gen NOT DETECTED NOT DETECTED Final    Comment: (NOTE) The GeneXpert MRSA Assay (FDA approved for NASAL specimens only), is one component of a comprehensive MRSA colonization surveillance program. It is not intended to diagnose MRSA infection nor to guide or monitor treatment for MRSA infections. Test performance is not FDA approved in patients less than 56 years old. Performed at [facility], [address] [place], [place] [address]     Labs: CBC: Recent Labs  Lab 10/13/22 1149 10/14/22 1100 10/15/22 0300 10/17/22 1028 10/19/22 0521  WBC 3.6* 2.9*  4.6 8.7 6.4  NEUTROABS  --  1.9 1.8 5.2  --   HGB 10.6* 10.3* 11.2* 11.9* 10.5*  HCT 31.4* 29.9* 32.3* 35.7* 32.4*  MCV 98.7 96.1 95.3 101.1* 101.9*  PLT 163 228 225 234 298   Basic Metabolic Panel: Recent Labs  Lab 10/13/22 1149 10/14/22 1100 10/15/22 0300 10/16/22 0318 10/17/22 0738 10/18/22 0323 10/19/22 0521  NA 133* 136 137 140 137 136 138  K 2.9* 3.4* 3.6 3.6 3.8 3.3* 3.3*  CL 95* 101 104 106 104 102 105  CO2 21* 23 21* 21* 16* 22 23  GLUCOSE 159* 154* 105* 119* 113* 102* 98  BUN <5* <5* <5* <5* 6 5* <5*  CREATININE 0.51 0.62 0.60 0.62 0.60 0.59 0.62  CALCIUM 7.7* 7.9* 8.3* 8.6* 8.4* 8.2* 8.3*  MG 1.4* 2.0 2.1 2.3  --   --  1.9  PHOS 1.4*  --   --   --   --   --   --    Liver Function Tests: Recent Labs  Lab 10/14/22 1100 10/15/22 0300 10/16/22 0318 10/17/22 1301 10/18/22 0809  AST 248* 188* 134* 77* 81*  ALT 127* 121* 103* 73* 70*  ALKPHOS 136* 148* 153* 135* 137*  BILITOT 1.9* 1.9* 2.0* 2.3* 1.6*  PROT 5.4* 5.6* 5.7* 6.0* 6.6  ALBUMIN 2.7* 2.7* 2.8* 2.7* 3.0*   CBG: Recent Labs  Lab 10/19/22 0726 10/19/22 1156 10/19/22 1724 10/19/22 2050 10/20/22 0722  GLUCAP 107* 108* 97 97 114*    Discharge time spent: 35 minutes.  Signed: Jacquelin Hawking, MD [facility] 10/20/2022

## 2022-10-20 NOTE — Care Plan (Signed)
IVC D/C'd. Discharge orders in. AVS given, pt belongings returned. VSS, pt A&O, IV removed. Pt refuses safe transport. Being transported via wheelchair to discharge lounge to be picked up by parents.

## 2022-10-20 NOTE — Plan of Care (Signed)
  Problem: Education: Goal: Knowledge of General Education information will improve Description: Including pain rating scale, medication(s)/side effects and non-pharmacologic comfort measures Outcome: Progressing   Problem: Health Behavior/Discharge Planning: Goal: Ability to manage health-related needs will improve Outcome: Progressing   Problem: Clinical Measurements: Goal: Ability to maintain clinical measurements within normal limits will improve Outcome: Progressing Goal: Will remain free from infection Outcome: Progressing Goal: Diagnostic test results will improve Outcome: Progressing Goal: Respiratory complications will improve Outcome: Progressing Goal: Cardiovascular complication will be avoided Outcome: Progressing   Problem: Activity: Goal: Risk for activity intolerance will decrease Outcome: Progressing   Problem: Nutrition: Goal: Adequate nutrition will be maintained Outcome: Progressing   Problem: Coping: Goal: Level of anxiety will decrease Outcome: Progressing   Problem: Elimination: Goal: Will not experience complications related to bowel motility Outcome: Progressing Goal: Will not experience complications related to urinary retention Outcome: Progressing   Problem: Pain Managment: Goal: General experience of comfort will improve Outcome: Progressing   Problem: Safety: Goal: Ability to remain free from injury will improve Outcome: Progressing   Problem: Skin Integrity: Goal: Risk for impaired skin integrity will decrease Outcome: Progressing   Problem: Education: Goal: Ability to describe self-care measures that may prevent or decrease complications (Diabetes Survival Skills Education) will improve Outcome: Progressing Goal: Individualized Educational Video(s) Outcome: Progressing   Problem: Coping: Goal: Ability to adjust to condition or change in health will improve Outcome: Progressing   Problem: Fluid Volume: Goal: Ability to  maintain a balanced intake and output will improve Outcome: Progressing   Problem: Health Behavior/Discharge Planning: Goal: Ability to identify and utilize available resources and services will improve Outcome: Progressing Goal: Ability to manage health-related needs will improve Outcome: Progressing   Problem: Metabolic: Goal: Ability to maintain appropriate glucose levels will improve Outcome: Progressing   Problem: Nutritional: Goal: Maintenance of adequate nutrition will improve Outcome: Progressing Goal: Progress toward achieving an optimal weight will improve Outcome: Progressing   Problem: Skin Integrity: Goal: Risk for impaired skin integrity will decrease Outcome: Progressing   Problem: Tissue Perfusion: Goal: Adequacy of tissue perfusion will improve Outcome: Progressing   Problem: Education: Goal: Knowledge of disease or condition will improve Outcome: Progressing Goal: Understanding of discharge needs will improve Outcome: Progressing   Problem: Health Behavior/Discharge Planning: Goal: Ability to identify changes in lifestyle to reduce recurrence of condition will improve Outcome: Progressing Goal: Identification of resources available to assist in meeting health care needs will improve Outcome: Progressing   Problem: Physical Regulation: Goal: Complications related to the disease process, condition or treatment will be avoided or minimized Outcome: Progressing   Problem: Safety: Goal: Ability to remain free from injury will improve Outcome: Progressing

## 2022-10-23 ENCOUNTER — Other Ambulatory Visit: Payer: Self-pay

## 2022-10-23 ENCOUNTER — Encounter (HOSPITAL_COMMUNITY): Payer: Self-pay | Admitting: Emergency Medicine

## 2022-10-23 ENCOUNTER — Emergency Department (HOSPITAL_COMMUNITY)
Admission: EM | Admit: 2022-10-23 | Discharge: 2022-10-24 | Discharge status: Against Medical Advice | Payer: Self-pay | Attending: Emergency Medicine | Admitting: Emergency Medicine

## 2022-10-23 DIAGNOSIS — F10231 Alcohol dependence with withdrawal delirium: Secondary | ICD-10-CM | POA: Insufficient documentation

## 2022-10-23 DIAGNOSIS — M791 Myalgia, unspecified site: Secondary | ICD-10-CM | POA: Insufficient documentation

## 2022-10-23 DIAGNOSIS — R519 Headache, unspecified: Secondary | ICD-10-CM | POA: Insufficient documentation

## 2022-10-23 DIAGNOSIS — E876 Hypokalemia: Secondary | ICD-10-CM | POA: Insufficient documentation

## 2022-10-23 DIAGNOSIS — R112 Nausea with vomiting, unspecified: Secondary | ICD-10-CM | POA: Insufficient documentation

## 2022-10-23 DIAGNOSIS — R7401 Elevation of levels of liver transaminase levels: Secondary | ICD-10-CM | POA: Insufficient documentation

## 2022-10-23 DIAGNOSIS — R000 Tachycardia, unspecified: Secondary | ICD-10-CM | POA: Insufficient documentation

## 2022-10-23 DIAGNOSIS — R251 Tremor, unspecified: Secondary | ICD-10-CM | POA: Insufficient documentation

## 2022-10-23 DIAGNOSIS — R7989 Other specified abnormal findings of blood chemistry: Secondary | ICD-10-CM

## 2022-10-23 DIAGNOSIS — F10939 Alcohol use, unspecified with withdrawal, unspecified: Secondary | ICD-10-CM

## 2022-10-23 DIAGNOSIS — F419 Anxiety disorder, unspecified: Secondary | ICD-10-CM | POA: Insufficient documentation

## 2022-10-23 DIAGNOSIS — Y906 Blood alcohol level of 120-199 mg/100 ml: Secondary | ICD-10-CM | POA: Insufficient documentation

## 2022-10-23 DIAGNOSIS — R197 Diarrhea, unspecified: Secondary | ICD-10-CM | POA: Insufficient documentation

## 2022-10-23 LAB — COMPREHENSIVE METABOLIC PANEL
ALT: 109 U/L — ABNORMAL HIGH (ref 0–44)
AST: 150 U/L — ABNORMAL HIGH (ref 15–41)
Albumin: 3.4 g/dL — ABNORMAL LOW (ref 3.5–5.0)
Alkaline Phosphatase: 99 U/L (ref 38–126)
Anion gap: 16 — ABNORMAL HIGH (ref 5–15)
BUN: <5 mg/dL — ABNORMAL LOW (ref 6–20)
CO2: 19 mmol/L — ABNORMAL LOW (ref 22–32)
Calcium: 8.9 mg/dL (ref 8.9–10.3)
Chloride: 101 mmol/L (ref 98–111)
Creatinine, Ser: 0.69 mg/dL (ref 0.44–1.00)
GFR, Estimated: >60 mL/min (ref >60)
Glucose, Bld: 142 mg/dL — ABNORMAL HIGH (ref 70–99)
Potassium: 3.1 mmol/L — ABNORMAL LOW (ref 3.5–5.1)
Sodium: 136 mmol/L (ref 135–145)
Total Bilirubin: 0.8 mg/dL (ref 0.3–1.2)
Total Protein: 6.9 g/dL (ref 6.5–8.1)

## 2022-10-23 LAB — HCG, SERUM, QUALITATIVE: Preg, Serum: NEGATIVE

## 2022-10-23 LAB — I-STAT VENOUS BLOOD GAS, ED
Acid-base deficit: 2 mmol/L (ref 0.0–2.0)
Bicarbonate: 21.6 mmol/L (ref 20.0–28.0)
Calcium, Ion: 1.08 mmol/L — ABNORMAL LOW (ref 1.15–1.40)
HCT: 35 % — ABNORMAL LOW (ref 36.0–46.0)
Hemoglobin: 11.9 g/dL — ABNORMAL LOW (ref 12.0–15.0)
O2 Saturation: 84 %
Potassium: 3.5 mmol/L (ref 3.5–5.1)
Sodium: 139 mmol/L (ref 135–145)
TCO2: 23 mmol/L (ref 22–32)
pCO2, Ven: 32.8 mmHg — ABNORMAL LOW (ref 44–60)
pH, Ven: 7.426 (ref 7.25–7.43)
pO2, Ven: 47 mmHg — ABNORMAL HIGH (ref 32–45)

## 2022-10-23 LAB — CBC
HCT: 36.5 % (ref 36.0–46.0)
Hemoglobin: 11.5 g/dL — ABNORMAL LOW (ref 12.0–15.0)
MCH: 33.6 pg (ref 26.0–34.0)
MCHC: 31.5 g/dL (ref 30.0–36.0)
MCV: 106.7 fL — ABNORMAL HIGH (ref 80.0–100.0)
Platelets: 522 10*3/uL — ABNORMAL HIGH (ref 150–400)
RBC: 3.42 MIL/uL — ABNORMAL LOW (ref 3.87–5.11)
RDW: 19.7 % — ABNORMAL HIGH (ref 11.5–15.5)
WBC: 6.7 10*3/uL (ref 4.0–10.5)
nRBC: 0 % (ref 0.0–0.2)

## 2022-10-23 LAB — URINALYSIS, ROUTINE W REFLEX MICROSCOPIC
Bilirubin Urine: NEGATIVE
Glucose, UA: NEGATIVE mg/dL
Hgb urine dipstick: NEGATIVE
Ketones, ur: NEGATIVE mg/dL
Leukocytes,Ua: NEGATIVE
Nitrite: NEGATIVE
Protein, ur: NEGATIVE mg/dL
Specific Gravity, Urine: 1.001 — ABNORMAL LOW (ref 1.005–1.030)
pH: 5 (ref 5.0–8.0)

## 2022-10-23 LAB — I-STAT CG4 LACTIC ACID, ED
Lactic Acid, Venous: 5.6 mmol/L (ref 0.5–1.9)
Lactic Acid, Venous: 5.1 mmol/L (ref 0.5–1.9)

## 2022-10-23 LAB — RAPID URINE DRUG SCREEN, HOSP PERFORMED
Amphetamines: NOT DETECTED
Barbiturates: POSITIVE — AB
Benzodiazepines: POSITIVE — AB
Cocaine: NOT DETECTED
Opiates: NOT DETECTED
Tetrahydrocannabinol: NOT DETECTED

## 2022-10-23 LAB — ETHANOL: Alcohol, Ethyl (B): 132 mg/dL — ABNORMAL HIGH (ref <10)

## 2022-10-23 LAB — SALICYLATE LEVEL: Salicylate Lvl: <7 mg/dL — ABNORMAL LOW (ref 7.0–30.0)

## 2022-10-23 LAB — ACETAMINOPHEN LEVEL: Acetaminophen (Tylenol), Serum: <10 ug/mL — ABNORMAL LOW (ref 10–30)

## 2022-10-23 MED ORDER — THIAMINE HCL 100 MG/ML IJ SOLN: 100.0000 mg | Freq: Every day | INTRAMUSCULAR | Status: DC

## 2022-10-23 MED ORDER — LORAZEPAM 2 MG/ML IJ SOLN
2.0000 mg | Freq: Once | INTRAMUSCULAR | Status: DC
  Filled 2022-10-23: qty 1

## 2022-10-23 MED ORDER — POTASSIUM CHLORIDE CRYS ER 20 MEQ PO TBCR
40.0000 meq | EXTENDED_RELEASE_TABLET | Freq: Once | ORAL | Status: AC
  Administered 2022-10-24: 40 meq via ORAL
  Filled 2022-10-23: qty 2

## 2022-10-23 MED ORDER — ONDANSETRON HCL 4 MG/2ML IJ SOLN
4.0000 mg | Freq: Once | INTRAMUSCULAR | Status: AC
  Administered 2022-10-24: 4 mg via INTRAVENOUS
  Filled 2022-10-23: qty 2

## 2022-10-23 MED ORDER — LORAZEPAM 1 MG PO TABS
1.0000 mg | ORAL_TABLET | Freq: Once | ORAL | Status: AC
  Administered 2022-10-23: 1 mg via ORAL
  Filled 2022-10-23: qty 1

## 2022-10-23 MED ORDER — LACTATED RINGERS IV BOLUS
2000.0000 mL | Freq: Once | INTRAVENOUS | Status: AC
  Administered 2022-10-23: 2000 mL via INTRAVENOUS

## 2022-10-23 MED ORDER — LOPERAMIDE HCL 2 MG PO CAPS
4.0000 mg | ORAL_CAPSULE | Freq: Once | ORAL | Status: AC
  Administered 2022-10-23: 4 mg via ORAL
  Filled 2022-10-23: qty 2

## 2022-10-23 MED ORDER — LACTATED RINGERS IV BOLUS
1000.0000 mL | Freq: Once | INTRAVENOUS | Status: AC
  Administered 2022-10-23: 1000 mL via INTRAVENOUS

## 2022-10-23 MED ORDER — LORAZEPAM 2 MG/ML IJ SOLN: 0.0000 mg | Freq: Four times a day (QID) | INTRAMUSCULAR | Status: DC

## 2022-10-23 MED ORDER — LORAZEPAM 2 MG/ML IJ SOLN: 0.0000 mg | Freq: Two times a day (BID) | INTRAMUSCULAR | Status: DC

## 2022-10-23 MED ORDER — THIAMINE MONONITRATE 100 MG PO TABS
100.0000 mg | ORAL_TABLET | Freq: Every day | ORAL | Status: DC
  Administered 2022-10-23: 100 mg via ORAL
  Filled 2022-10-23: qty 1

## 2022-10-23 MED ORDER — LORAZEPAM 1 MG PO TABS: 0.0000 mg | ORAL_TABLET | Freq: Two times a day (BID) | ORAL | Status: DC

## 2022-10-23 MED ORDER — LORAZEPAM 1 MG PO TABS
0.0000 mg | ORAL_TABLET | Freq: Four times a day (QID) | ORAL | Status: DC
  Administered 2022-10-23: 1 mg via ORAL
  Filled 2022-10-23: qty 1

## 2022-10-23 NOTE — ED Triage Notes (Signed)
Pt reports she is here for alcohol withdrawal.  Pt reports drinking every day. States she is supposed to go to [facility] on Friday but wants to get clean before then.  Pt anxious, diaphoretic, and tachycardic in triage. Reports last drink was 1530 today.

## 2022-10-23 NOTE — ED Notes (Signed)
Pt repeatedly exiting room. Pt states "I need more ativan". Pt also making statements that she is going to have a seizure.

## 2022-10-23 NOTE — ED Notes (Signed)
Pt becoming extremely restless and refusing to stay in bed. Pt constantly requesting to be removed from monitor and going to restroom. Pt also reports she has diarrhea. Provider made aware.

## 2022-10-23 NOTE — ED Notes (Signed)
Pt resting in bed with eyes closed. Equal rise and fall of chest noted. Respirations even and unlabored.

## 2022-10-23 NOTE — ED Provider Notes (Signed)
EMERGENCY DEPARTMENT AT [facility] Provider Note   CSN: [id number] Arrival date & time: 10/23/22  1758    History  Chief Complaint  Patient presents with   Delirium Tremens (DTS)    Heather Shepherd is a 32 y.o. female here for evaluation of alcohol withdrawal.  Patient states she is a daily alcohol drinker, drinks two fifths of vodka daily.  Patient states she has headache, shakes, nausea, vomiting, diarrhea, myalgias.  Last EtOH use earlier today.  Appears anxious, tachycardic.  Also states she intermittently uses fentanyl, last use 6 days ago per patient.  She was admitted week and a half ago to the ICU here requiring Precedex and phenobarbital for DTs.  Subsequently discharged 3 days ago.  She was also seen by [facility] on 8/4 for alcohol withdrawal.  She subsequently left AGAINST MEDICAL ADVICE at that time.  Patient denies any SI, HI, AVH.  States she "feels like she is going to have a seizure."  No AVH.  She denies any recent falls or injuries.  HPI     Home Medications Prior to Admission medications   Medication Sig Start Date End Date Taking? Authorizing Provider  cefpodoxime (VANTIN) 200 MG tablet Take 1 tablet (200 mg total) by mouth 2 (two) times daily for 5 days. 10/20/22 10/25/22 Yes Narda Bonds, MD  folic acid (FOLVITE) 1 MG tablet Take 1 tablet (1 mg total) by mouth daily. 10/20/22  Yes Narda Bonds, MD  gabapentin (NEURONTIN) 100 MG capsule Take 2 capsules (200 mg total) by mouth 3 (three) times daily for 21 days. 10/20/22 11/10/22 Yes Narda Bonds, MD  Multiple Vitamin (MULTIVITAMIN WITH MINERALS) TABS tablet Take 1 tablet by mouth daily. 10/20/22 01/18/23 Yes Narda Bonds, MD  thiamine (VITAMIN B-1) 100 MG tablet Take 1 tablet (100 mg total) by mouth daily. Patient not taking: Reported on 10/23/2022 10/20/22   Narda Bonds, MD      Allergies    Ibuprofen    Review of Systems   Review of Systems  Constitutional:  Positive for  activity change, appetite change, diaphoresis and fatigue.  HENT: Negative.    Respiratory: Negative.    Cardiovascular: Negative.   Gastrointestinal:  Positive for diarrhea, nausea and vomiting. Negative for rectal pain.  Genitourinary: Negative.   Musculoskeletal:  Positive for myalgias.  Skin: Negative.   Neurological:  Positive for weakness (generalized) and headaches. Negative for dizziness, tremors, seizures, syncope, facial asymmetry, speech difficulty, light-headedness and numbness.  All other systems reviewed and are negative.   Physical Exam Updated Vital Signs BP 135/75 (BP Location: Right Arm)   Pulse 97   Temp 99.2 F (37.3 C) (Oral)   Resp (!) 24   LMP 09/28/2022 (Approximate)   SpO2 96%  Physical Exam Vitals and nursing note reviewed.  Constitutional:      General: She is not in acute distress.    Appearance: She is well-developed. She is not ill-appearing, toxic-appearing or diaphoretic.  HENT:     Head: Atraumatic.     Nose: Nose normal.     Mouth/Throat:     Mouth: Mucous membranes are moist.  Eyes:     Pupils: Pupils are equal, round, and reactive to light.  Cardiovascular:     Rate and Rhythm: Tachycardia present.  Pulmonary:     Effort: Pulmonary effort is normal. No respiratory distress.     Breath sounds: Normal breath sounds.     Comments: Clear bilaterally Abdominal:  General: Bowel sounds are normal. There is no distension.     Palpations: Abdomen is soft.     Tenderness: There is no abdominal tenderness. There is no right CVA tenderness, left CVA tenderness or guarding.  Musculoskeletal:        General: Normal range of motion.     Cervical back: Normal range of motion.     Comments: No bony tenderness, compartments soft, full range of motion  Skin:    General: Skin is warm and dry.     Capillary Refill: Capillary refill takes less than 2 seconds.     Comments: Old contusions, abrasions  Neurological:     General: No focal deficit  present.     Mental Status: She is alert and oriented to person, place, and time.     Motor: Tremor present.     Comments: Cranial nerves II to XII grossly intact Equal handgrip Tremor bilateral hands  Psychiatric:        Mood and Affect: Mood is anxious.     Comments: Anxious, pacing room Denies SI, HI, AVH    ED Results / Procedures / Treatments   Labs (all labs ordered are listed, but only abnormal results are displayed) Labs Reviewed  COMPREHENSIVE METABOLIC PANEL - Abnormal; Notable for the following components:      Result Value   Potassium 3.1 (*)    CO2 19 (*)    Glucose, Bld 142 (*)    BUN <5 (*)    Albumin 3.4 (*)    AST 150 (*)    ALT 109 (*)    Anion gap 16 (*)    All other components within normal limits  ETHANOL - Abnormal; Notable for the following components:   Alcohol, Ethyl (B) 132 (*)    All other components within normal limits  CBC - Abnormal; Notable for the following components:   RBC 3.42 (*)    Hemoglobin 11.5 (*)    MCV 106.7 (*)    RDW 19.7 (*)    Platelets 522 (*)    All other components within normal limits  RAPID URINE DRUG SCREEN, HOSP PERFORMED - Abnormal; Notable for the following components:   Benzodiazepines POSITIVE (*)    Barbiturates POSITIVE (*)    All other components within normal limits  SALICYLATE LEVEL - Abnormal; Notable for the following components:   Salicylate Lvl <7.0 (*)    All other components within normal limits  ACETAMINOPHEN LEVEL - Abnormal; Notable for the following components:   Acetaminophen (Tylenol), Serum <10 (*)    All other components within normal limits  URINALYSIS, ROUTINE W REFLEX MICROSCOPIC - Abnormal; Notable for the following components:   Color, Urine STRAW (*)    Specific Gravity, Urine 1.001 (*)    All other components within normal limits  I-STAT VENOUS BLOOD GAS, ED - Abnormal; Notable for the following components:   pCO2, Ven 32.8 (*)    pO2, Ven 47 (*)    Calcium, Ion 1.08 (*)    HCT  35.0 (*)    Hemoglobin 11.9 (*)    All other components within normal limits  I-STAT CG4 LACTIC ACID, ED - Abnormal; Notable for the following components:   Lactic Acid, Venous 5.6 (*)    All other components within normal limits  I-STAT CG4 LACTIC ACID, ED - Abnormal; Notable for the following components:   Lactic Acid, Venous 5.1 (*)    All other components within normal limits  HCG, SERUM, QUALITATIVE  I-STAT  CG4 LACTIC ACID, ED    EKG EKG Interpretation Date/Time:  Tuesday October 23 2022 19:05:56 EDT Ventricular Rate:  116 PR Interval:  112 QRS Duration:  96 QT Interval:  367 QTC Calculation: 510 R Axis:   90  Text Interpretation: Sinus tachycardia Borderline right axis deviation Confirmed by Alvino Blood ([id number]) on 10/23/2022 8:19:25 PM  Radiology No results found.  Procedures Procedures    Medications Ordered in ED Medications  LORazepam (ATIVAN) injection 0-4 mg ( Intravenous See Alternative 10/23/22 1952)    Or  LORazepam (ATIVAN) tablet 0-4 mg (1 mg Oral Given 10/23/22 1952)  LORazepam (ATIVAN) injection 0-4 mg (has no administration in time range)    Or  LORazepam (ATIVAN) tablet 0-4 mg (has no administration in time range)  thiamine (VITAMIN B1) tablet 100 mg (100 mg Oral Given 10/23/22 1952)    Or  thiamine (VITAMIN B1) injection 100 mg ( Intravenous See Alternative 10/23/22 1952)  LORazepam (ATIVAN) injection 2 mg (2 mg Intravenous Not Given 10/23/22 2248)  ondansetron (ZOFRAN) injection 4 mg (has no administration in time range)  potassium chloride SA (KLOR-CON M) CR tablet 40 mEq (has no administration in time range)  lactated ringers bolus 2,000 mL (0 mLs Intravenous Stopped 10/23/22 2145)  loperamide (IMODIUM) capsule 4 mg (4 mg Oral Given 10/23/22 2041)  LORazepam (ATIVAN) tablet 1 mg (1 mg Oral Given 10/23/22 2041)  lactated ringers bolus 1,000 mL (1,000 mLs Intravenous New Bag/Given 10/23/22 2246)    ED Course/ Medical Decision Making/ A&P    32 year old  here for evaluation of alcohol withdrawal.  Does have history of DTs, recent ICU admission week and a half ago.  Drinks alcohol daily, 2 fifths of liquor.  Most recent drink earlier today.  Today due to nausea, vomiting, shakes, headache, myalgias and sensation of feeling like she is going to have a seizure.  She denies actual seizure.  No recent injury or trauma.  On arrival she is tachycardic, appears anxious.  Also admits to fentanyl use however last used 1 week ago.  Discharged from ICU here 3 days ago, was seen 2 days ago at [facility] for detox however left AMA.  On arrival patient tachycardic however not hypertensive.  She denies SI, HI, AVH.  Initial CIWA 14.  Will give Ativan, IV fluids.  Patient reassessed.  States she is having diarrhea.  She has benign abdominal exam.  Will give Imodium.  Of note she keeps unhooking herself off of her IV and coming out to the nurses station.  Appears anxious.  Additional Ativan.  Labs and imaging personally viewed and interpreted:  CBC without leukocytosis, hemoglobin 11.5 Metabolic panel potassium 3.1, AST, ALT elevation however history of similar, anion gap 16 VBG without acidosis Ethanol 132 Salicylate, acetaminophen within normal limits Pregnancy test negative UA negative for infection UDS positive for benzos, barbiturates Lactic acid 5.6--5.1  Patient reassessed.  Still feels nauseous, will give Zofran, QTc 510, will also give additional IV fluids.  She states she is vomiting.  States she feels feels shaky, myalgias and does not feel well.  Give additional Ativan.  Nursing states she went into patient's room and patient somnolent.  Patient does have history of illicit substance use while in the hospital however nursing states patient does not have any belonging bags, will hold off on additional Ativan.  She is protecting her airway.  Will continue to monitor.  She is continue to get IV fluids.  Will plan on additional lactic once  IV  fluids completed total 3 L.    Care transferred to [facility], PA-C who will follow-up on remaining workup and dispo                                Medical Decision Making Amount and/or Complexity of Data Reviewed External Data Reviewed: labs, radiology, ECG and notes. Labs: ordered. Decision-making details documented in ED Course. Radiology: ordered and independent interpretation performed. Decision-making details documented in ED Course. ECG/medicine tests: ordered and independent interpretation performed. Decision-making details documented in ED Course.  Risk OTC drugs. Prescription drug management. Parenteral controlled substances. Diagnosis or treatment significantly limited by social determinants of health.          Final Clinical Impression(s) / ED Diagnoses Final diagnoses:  Alcohol withdrawal syndrome with complication (HCC)  Elevated lactic acid level  Hypokalemia  Elevated LFTs    Rx / DC Orders ED Discharge Orders     None         Wiktoria Hemrick A, PA-C 10/23/22 2347    Lonell Grandchild, MD 11/01/22 [phone]

## 2022-10-23 NOTE — ED Provider Notes (Signed)
Accepted handoff at shift change from [facility], PA-C. Please see prior provider note for more detail.   Briefly: Patient is 32 y.o. presents to the ED for evaluation of alcohol withdrawal.  Patient consumes alcohol daily.  Patient complaining of headache, shakes, nausea, vomiting, diarrhea, and myalgias upon arrival.  Last ETOH earlier today.  Patient was in ICU at [facility] for alcohol withdrawal.  She was also evaluated at [facility] on 8/4 for same, but left AGAINST MEDICAL ADVICE at that time.   DDX: concern for alcohol withdrawal  Plan: fluid bolus, repeat lactic after; admit to hospital for elevated lactic, alcohol withdrawal     Results for orders placed or performed during the hospital encounter of 10/23/22  Comprehensive metabolic panel  Result Value Ref Range   Sodium 136 135 - 145 mmol/L   Potassium 3.1 (L) 3.5 - 5.1 mmol/L   Chloride 101 98 - 111 mmol/L   CO2 19 (L) 22 - 32 mmol/L   Glucose, Bld 142 (H) 70 - 99 mg/dL   BUN <5 (L) 6 - 20 mg/dL   Creatinine, Ser 4.09 0.44 - 1.00 mg/dL   Calcium 8.9 8.9 - 81.1 mg/dL   Total Protein 6.9 6.5 - 8.1 g/dL   Albumin 3.4 (L) 3.5 - 5.0 g/dL   AST 914 (H) 15 - 41 U/L   ALT 109 (H) 0 - 44 U/L   Alkaline Phosphatase 99 38 - 126 U/L   Total Bilirubin 0.8 0.3 - 1.2 mg/dL   GFR, Estimated >78 >29 mL/min   Anion gap 16 (H) 5 - 15  Ethanol  Result Value Ref Range   Alcohol, Ethyl (B) 132 (H) <10 mg/dL  cbc  Result Value Ref Range   WBC 6.7 4.0 - 10.5 K/uL   RBC 3.42 (L) 3.87 - 5.11 MIL/uL   Hemoglobin 11.5 (L) 12.0 - 15.0 g/dL   HCT 56.2 13.0 - 86.5 %   MCV 106.7 (H) 80.0 - 100.0 fL   MCH 33.6 26.0 - 34.0 pg   MCHC 31.5 30.0 - 36.0 g/dL   RDW 78.4 (H) 69.6 - 29.5 %   Platelets 522 (H) 150 - 400 K/uL   nRBC 0.0 0.0 - 0.2 %  Rapid urine drug screen (hospital performed)  Result Value Ref Range   Opiates NONE DETECTED NONE DETECTED   Cocaine NONE DETECTED NONE DETECTED   Benzodiazepines POSITIVE (A) NONE  DETECTED   Amphetamines NONE DETECTED NONE DETECTED   Tetrahydrocannabinol NONE DETECTED NONE DETECTED   Barbiturates POSITIVE (A) NONE DETECTED  hCG, serum, qualitative  Result Value Ref Range   Preg, Serum NEGATIVE NEGATIVE  Salicylate level  Result Value Ref Range   Salicylate Lvl <7.0 (L) 7.0 - 30.0 mg/dL  Acetaminophen level  Result Value Ref Range   Acetaminophen (Tylenol), Serum <10 (L) 10 - 30 ug/mL  Urinalysis, Routine w reflex microscopic -Urine, Clean Catch  Result Value Ref Range   Color, Urine STRAW (A) YELLOW   APPearance CLEAR CLEAR   Specific Gravity, Urine 1.001 (L) 1.005 - 1.030   pH 5.0 5.0 - 8.0   Glucose, UA NEGATIVE NEGATIVE mg/dL   Hgb urine dipstick NEGATIVE NEGATIVE   Bilirubin Urine NEGATIVE NEGATIVE   Ketones, ur NEGATIVE NEGATIVE mg/dL   Protein, ur NEGATIVE NEGATIVE mg/dL   Nitrite NEGATIVE NEGATIVE   Leukocytes,Ua NEGATIVE NEGATIVE  I-Stat venous blood gas, ([facility] ED, [facility], [facility])  Result Value Ref Range   pH, Ven 7.426 7.25 - 7.43  pCO2, Ven 32.8 (L) 44 - 60 mmHg   pO2, Ven 47 (H) 32 - 45 mmHg   Bicarbonate 21.6 20.0 - 28.0 mmol/L   TCO2 23 22 - 32 mmol/L   O2 Saturation 84 %   Acid-base deficit 2.0 0.0 - 2.0 mmol/L   Sodium 139 135 - 145 mmol/L   Potassium 3.5 3.5 - 5.1 mmol/L   Calcium, Ion 1.08 (L) 1.15 - 1.40 mmol/L   HCT 35.0 (L) 36.0 - 46.0 %   Hemoglobin 11.9 (L) 12.0 - 15.0 g/dL   Sample type VENOUS   I-Stat CG4 Lactic Acid  Result Value Ref Range   Lactic Acid, Venous 5.6 (HH) 0.5 - 1.9 mmol/L   Comment NOTIFIED PHYSICIAN   I-Stat CG4 Lactic Acid  Result Value Ref Range   Lactic Acid, Venous 5.1 (HH) 0.5 - 1.9 mmol/L   Comment NOTIFIED PHYSICIAN    DG Chest 2 View  Result Date: 10/19/2022 CLINICAL DATA:  Productive cough, fever EXAM: CHEST - 2 VIEW COMPARISON:  01/13/2021 FINDINGS: There is poor inspiration. There are no signs of pulmonary edema or focal pulmonary consolidation. There is no pleural effusion or pneumothorax.  IMPRESSION: There are no signs of pulmonary edema or focal pulmonary consolidation. Poor inspiration. Electronically Signed   By: Ernie Avena M.D.   On: 10/19/2022 16:30   EEG adult  Result Date: 10/18/2022 Reece Levy, MD     10/18/2022  5:52 PM [facility] Routine EEG Report Video Performed: Performed Demographics: Patient Name:   [name] Date of Birth:   1990/07/15 Identification Number:   MRN - [id number] Study Times: Study Start Time:   10/18/2022 18:01:00 Study End Time:   10/18/2022 18:28:00 Duration:   27 minutes Indication(s): Encephalopathy Technical Summary: This EEG was performed utilizing standard International 10-20 System of electrode placement. One channel electrocardiogram was monitored. Data were obtained and interpreted utilizing referential montage recording, with reformatting to longitudinal, transverse bipolar, and referential montages as necessary for interpretation. State(s):       Awake Activation Procedures: Hyperventilation: Not performed Photic Stimulation: Not performed EEG Description: During wakefulness, the background showed a continuous 9Hz  posterior dominant alpha rhythm which was fairly modulated, symmetrical and reactive to eye opening.  Drowsiness or sleep stage II was not reached.  There was no clinical event or button-pushed event noted.  Throughout the record, there was no epileptiform abnormality or lateralizing sign observed.  Intermittent electrode and movement artifacts were noted. Impression: This is a normal awake EEG. No epileptiform abnormality or lateralizing sign is observed.  Note that an absence of epileptiform abnormality does not exclude a diagnosis of epilepsy. Dr Reece Levy [facility] For Inpatient follow-up with [facility] physician please call [facility] [phone]. This is not an outpatient service. Post hospital discharge, please contact hospital directly. Please do not  communicate with [facility] physicians via secure chat. If you have any questions, Please contact [facility]. Please call or reconsult our service if there are any clinical or diagnostic changes.  CT ABDOMEN PELVIS W CONTRAST  Result Date: 10/12/2022 CLINICAL DATA:  Alcohol withdrawal, abdominal pain, nausea and vomiting, lactic acidosis, elevated LFTs EXAM: CT ABDOMEN AND PELVIS WITH CONTRAST TECHNIQUE: Multidetector CT imaging of the abdomen and pelvis was performed using the standard protocol following bolus administration of intravenous contrast. RADIATION DOSE REDUCTION: This exam was performed according to the departmental dose-optimization program which includes automated exposure control, adjustment of the mA and/or kV according to patient size and/or use of iterative reconstruction technique.  CONTRAST:  80mL OMNIPAQUE IOHEXOL 300 MG/ML  SOLN COMPARISON:  02/21/2011 FINDINGS: Lower chest: No acute pleural or parenchymal lung disease. Hepatobiliary: Liver is markedly enlarged, with diffuse hepatic steatosis. No focal parenchymal liver abnormality or biliary duct dilation. The gallbladder is unremarkable. Pancreas: Pancreatic parenchymal atrophy, with parenchymal calcifications consistent with sequela of chronic calcific pancreatitis. No acute inflammatory changes or pancreatic duct dilation. Spleen: Normal in size without focal abnormality. Adrenals/Urinary Tract: Adrenal glands are unremarkable. Kidneys are normal, without renal calculi, focal lesion, or hydronephrosis. Bladder is unremarkable. Stomach/Bowel: No bowel obstruction or ileus. Normal appendix right lower quadrant. No bowel wall thickening or inflammatory change. Vascular/Lymphatic: No significant vascular findings are present. No enlarged abdominal or pelvic lymph nodes. Reproductive: Uterus and bilateral adnexa are unremarkable. Other: No free fluid or free intraperitoneal gas. Small fat containing umbilical hernia. No bowel herniation.  Musculoskeletal: No acute or destructive bony abnormalities. Reconstructed images demonstrate no additional findings. IMPRESSION: 1. Hepatomegaly, with diffuse hepatic steatosis. 2. Sequela of chronic calcific pancreatitis. No acute inflammatory change. 3. No acute intra-abdominal or intrapelvic process. Electronically Signed   By: Sharlet Salina M.D.   On: 10/12/2022 15:47   CT Head Wo Contrast  Result Date: 10/12/2022 CLINICAL DATA:  Head trauma EXAM: CT HEAD WITHOUT CONTRAST TECHNIQUE: Contiguous axial images were obtained from the base of the skull through the vertex without intravenous contrast. RADIATION DOSE REDUCTION: This exam was performed according to the departmental dose-optimization program which includes automated exposure control, adjustment of the mA and/or kV according to patient size and/or use of iterative reconstruction technique. COMPARISON:  CT head 01/17/2021 FINDINGS: Brain: There is no acute intracranial hemorrhage, extra-axial fluid collection, or acute infarct. Parenchymal volume is normal. The ventricles are normal in size. Gray-white differentiation is preserved The pituitary and suprasellar region are normal. There is no mass lesion. There is no mass effect or midline shift. Vascular: No hyperdense vessel or unexpected calcification. Skull: Normal. Negative for fracture or focal lesion. Sinuses/Orbits: The imaged paranasal sinuses are clear. There is a dysconjugate gaze. The globes and orbits are otherwise unremarkable. Other: The mastoid air cells and middle ear cavities are clear. IMPRESSION: Dysconjugate gaze.  Otherwise, unremarkable head CT. Electronically Signed   By: Lesia Hausen M.D.   On: 10/12/2022 08:22    Physical Exam  BP 135/75 (BP Location: Right Arm)   Pulse 97   Temp 99.2 F (37.3 C) (Oral)   Resp (!) 24   LMP 09/28/2022 (Approximate)   SpO2 96%   Physical Exam Vitals and nursing note reviewed.  Constitutional:      General: She is not in acute  distress.    Appearance: Normal appearance. She is not ill-appearing or diaphoretic.  Cardiovascular:     Rate and Rhythm: Regular rhythm. Tachycardia present.  Pulmonary:     Effort: Pulmonary effort is normal.  Skin:    General: Skin is warm and dry.     Capillary Refill: Capillary refill takes less than 2 seconds.  Neurological:     Mental Status: She is alert. Mental status is at baseline.     Motor: No tremor.     Comments: No visible tremors at rest or with movement of extremities.   Psychiatric:        Mood and Affect: Mood normal.        Behavior: Behavior normal.     Procedures  Procedures  ED Course / MDM    Medical Decision Making Amount and/or Complexity of Data Reviewed  Labs: ordered.  Risk OTC drugs. Prescription drug management. Decision regarding hospitalization.   Patient's repeat lactic 3.8, she continues to trend downward with fluid resuscitation.  Will give patient maintenance fluid of 125/hr.  Patient requesting more imodium and a nicotine patch, these were ordered and given.  She states that she feels unwell and like she "wants to go out and drink".  Patient states she does want to get help for her alcohol abuse and is here voluntarily.    I requested consultation with on-call hospitalist and spoke with Dr. Carollee Herter who agreed with hospital admission.  AM provider will come see patient.    Informed by RN that patient continues to ask for repeat doses of Ativan, however, based on CIWA protocol, patient does not currently have symptoms that warrants administration.  Patient states that she needs something to help her sleep.  Patient given melatonin.  Informed by RN that patient was dry heaving, forcefully gagging, and complaining of abdominal pain.  Ordered repeat dose of Zofran.    Made aware by RN that patient asked for her IV to be removed so she could go home.  Patient refused further care and subsequently left the ED.  Attending, Dr. Pilar Plate, also made  aware.            Melton Alar R, PA-C 10/24/22 0630    Sabas Sous, MD 10/24/22 [phone]

## 2022-10-23 NOTE — ED Notes (Signed)
Entered room to administer medications and obtain vital signs. Pt in bed laying on side with eyes closed. Respirations are even and unlabored. Equal chest rise and fall noted. Pt appears to be resting comfortably. Woke pt to obtain oral temperature, pt cooperative but falls back to sleep with thermometer in mouth. Will hold ativan at this time due to pt drowsiness. Provider notified.

## 2022-10-23 NOTE — ED Notes (Signed)
Pt repeatedly removing all monitoring equipment. Educated pt that we need this equipment to remain in place during their treatment. Pt placed back on monitor.

## 2022-10-23 NOTE — ED Notes (Signed)
Pt repeatedly asking if she will be admitted. Attempted to educate pt on admission process and decision for admission has not yet been made because we are still running tests. Pt states "but I need to be admitted". Attempted to educate pt that there are requirements for admitting pts and that we cannot admit pt just because they feel like they need to be in the hospital. Pt then states "what if I say I want to hurt myself, then they will have to admit me". Attempted to educate pt that making such statements that if found untruthful will not warrant an admission and pt would possibly be classified as malingering. Pt acknowledges what has been said and appears to be in understanding. Pt currently denies SI at this time.

## 2022-10-24 MED ORDER — LOPERAMIDE HCL 2 MG PO CAPS
4.0000 mg | ORAL_CAPSULE | Freq: Once | ORAL | Status: AC
  Administered 2022-10-24: 4 mg via ORAL
  Filled 2022-10-24: qty 2

## 2022-10-24 MED ORDER — MELATONIN 3 MG PO TABS
3.0000 mg | ORAL_TABLET | Freq: Every day | ORAL | Status: DC
  Administered 2022-10-24: 3 mg via ORAL
  Filled 2022-10-24: qty 1

## 2022-10-24 MED ORDER — MELATONIN 3 MG PO TABS: 3.0000 mg | ORAL_TABLET | Freq: Every day | ORAL | Status: DC

## 2022-10-24 MED ORDER — ONDANSETRON HCL 4 MG/2ML IJ SOLN
4.0000 mg | Freq: Once | INTRAMUSCULAR | Status: AC
  Administered 2022-10-24: 4 mg via INTRAVENOUS
  Filled 2022-10-24: qty 2

## 2022-10-24 MED ORDER — NICOTINE 21 MG/24HR TD PT24
21.0000 mg | MEDICATED_PATCH | Freq: Once | TRANSDERMAL | Status: DC
  Administered 2022-10-24: 21 mg via TRANSDERMAL
  Filled 2022-10-24: qty 1

## 2022-10-24 MED ORDER — LACTATED RINGERS IV SOLN: INTRAVENOUS | Status: DC

## 2022-10-24 NOTE — ED Notes (Signed)
Pt with small amount of yellow emesis in bed. Gown and linens changed.

## 2022-10-24 NOTE — ED Notes (Signed)
Pt now requesting nicotine patch and to speak to provider. Provider made aware.

## 2022-10-24 NOTE — ED Notes (Signed)
Pt requesting Ativan again. Pt states she feels unwell and needs the Ativan. Pt does not meet criteria for ativan per [facility] protocol. Pt states she has the "cold shakes" and begins to shake her right hand. Pt had no shaking or tremor prior and movement was purposeful. Pt also states she feels hot. Pt has no sweating, no clamminess, and afebrile at this time. Pt then states she needs the ativan to help her sleep. Informed pt that this RN would request sleep medication from provider. Pt also states diarrhea is back. Informed pt that this RN would request more antidiarrheal medication from provider. Provider made aware.

## 2022-10-24 NOTE — ED Notes (Signed)
PT disconnected self from monitor and IV to go to restroom. Pt educated on not disconnecting the IV and to call for assistance using the call bell.

## 2022-10-24 NOTE — ED Notes (Signed)
Pt notified staff that she wanted to leave because "she could not stay here any longer, I need to be home in my own bed and not in this hospital." Pt was encouraged to stay and receive full treatment for her care, but pt continues to refuse and asked for he IV to be removed so she could go home. Pilar Plate, MD made aware.

## 2022-10-24 NOTE — ED Notes (Signed)
Pt forcefully gagging in room. Pt states she is now having abdominal pain.

## 2022-10-24 NOTE — ED Notes (Signed)
Pt returned from restroom stating she now has double vision. Pupils assessed and are equal and reactive. Provider notified.

## 2022-10-24 NOTE — ED Notes (Addendum)
Pt still resting in bed with eyes closed. Pt easily woken to take PO medication as ordered but goes back to sleep right after taking medication. Repeat lactic drawn at this time and pt continues to sleep. Respirations are even and unlabored. Equal chest rise and fall noted. As this RN begins to leave the room, pt requests more ativan. Attempted to educate pt that she currently has no symptoms that warrant ativan administration per [facility]WA protocol and that she is too somnolent to be given the medication. Pt groans and falls back asleep.

## 2023-01-24 IMAGING — CT CT HEAD W/O CM
3 series · 14 of 47 positions shown, 16 images · non-contrast
Comparison: CT head max face 01/10/2021

CLINICAL DATA: Mental status change, unknown cause.

EXAM:
CT HEAD WITHOUT CONTRAST
CT MAXILLOFACIAL WITHOUT CONTRAST
TECHNIQUE: Multidetector CT imaging of the head and maxillofacial structures
were performed using the standard protocol without intravenous
contrast. Multiplanar CT image reconstructions of the maxillofacial
structures were also generated.

[Series 3: head wo · axial · 0.47mm/px · z∈[-182,-48]mm · 8 of 33 slices shown, 10 images]
[im 3/33  brain]
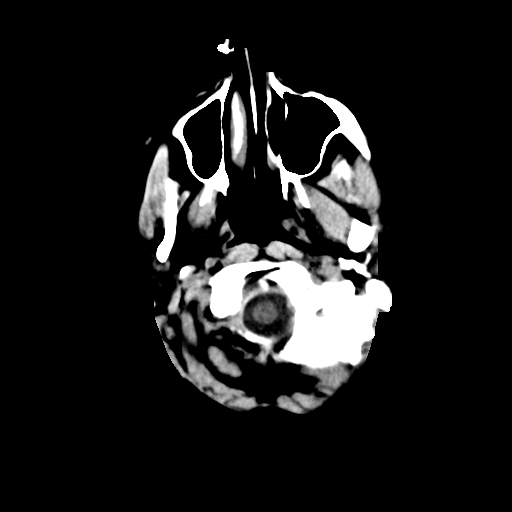
[im 3/33  bone]
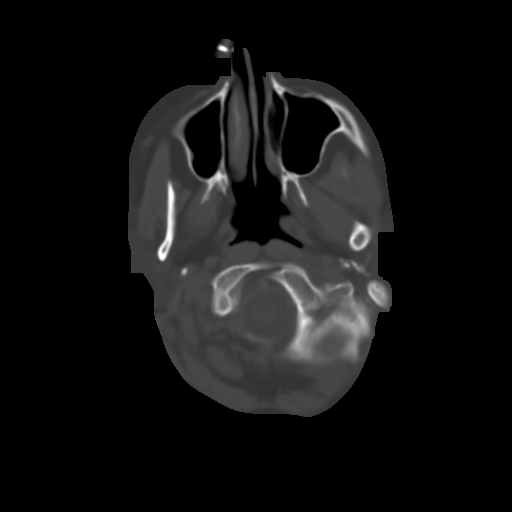
[im 7/33  brain]
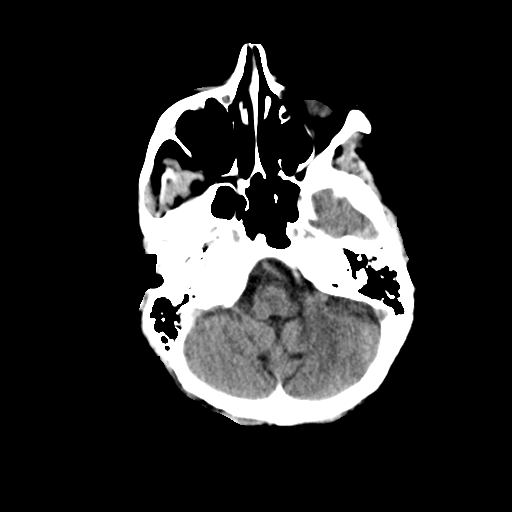
[im 10/33  brain]
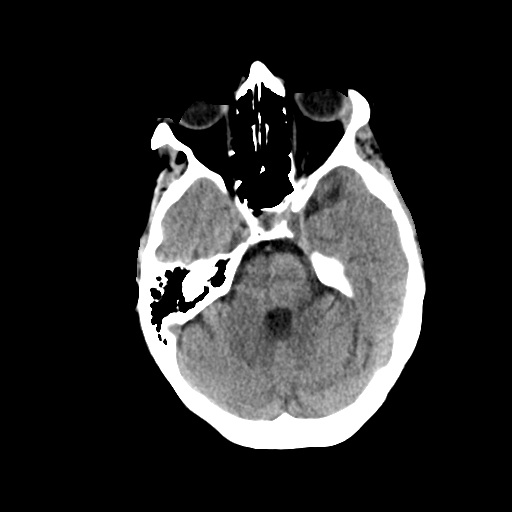
[im 15/33  brain]
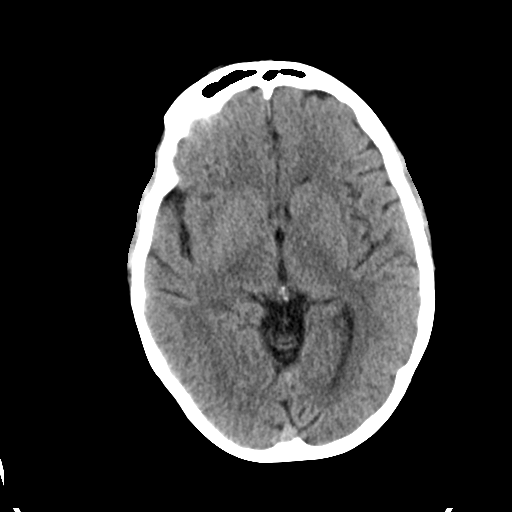
[im 18/33  brain]
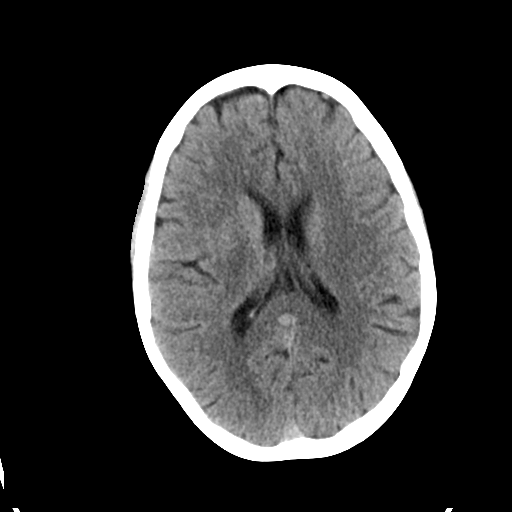
[im 18/33  bone]
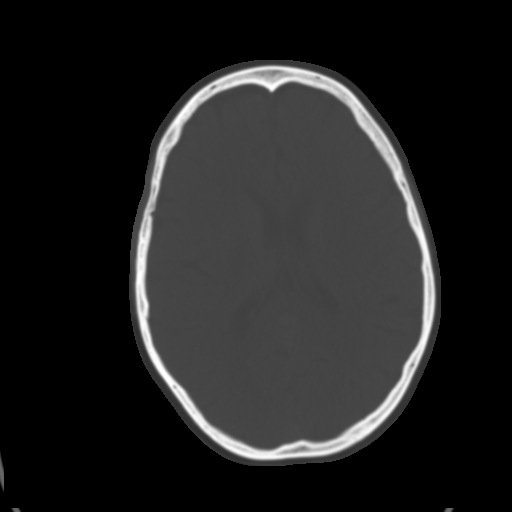
[im 23/33  brain]
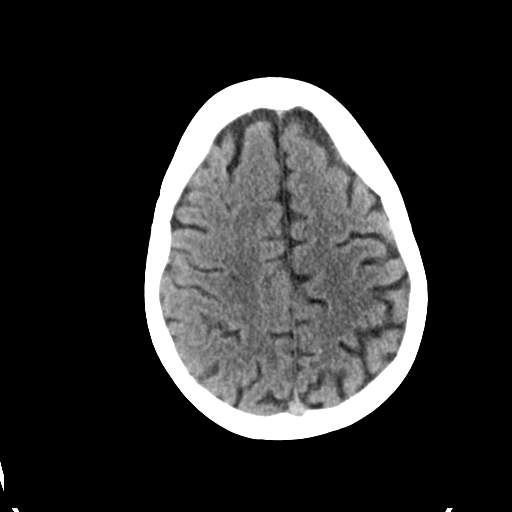
[im 26/33  brain]
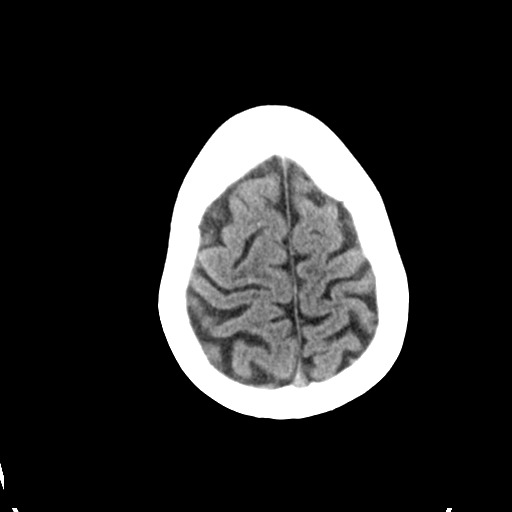
[im 30/33  brain]
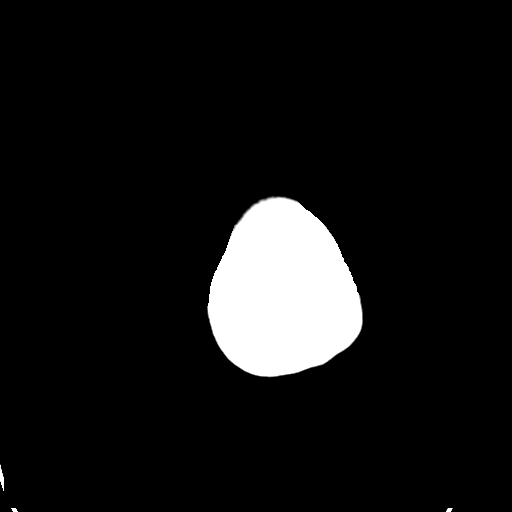

[Series 6: coronal soft tissue · coronal · 0.32mm/px · 3 of 67 slices shown]
[im 23/67  brain]
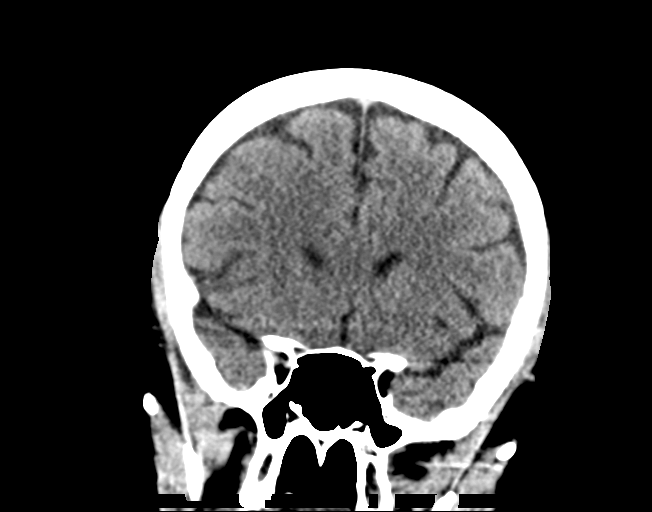
[im 30/67  brain]
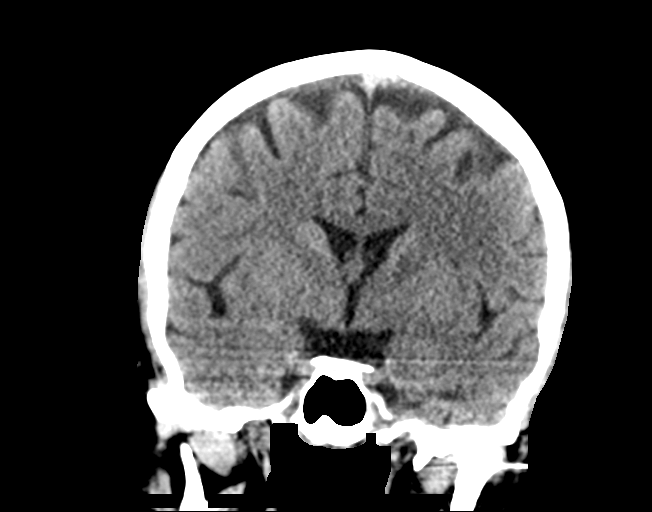
[im 37/67  brain]
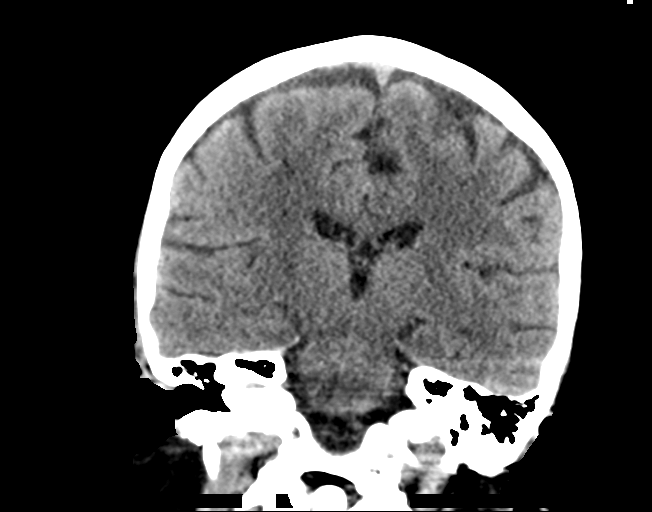

[Series 7: sagittal soft tissue · sagittal · 0.32mm/px · 3 of 66 slices shown]
[im 22/66  brain]
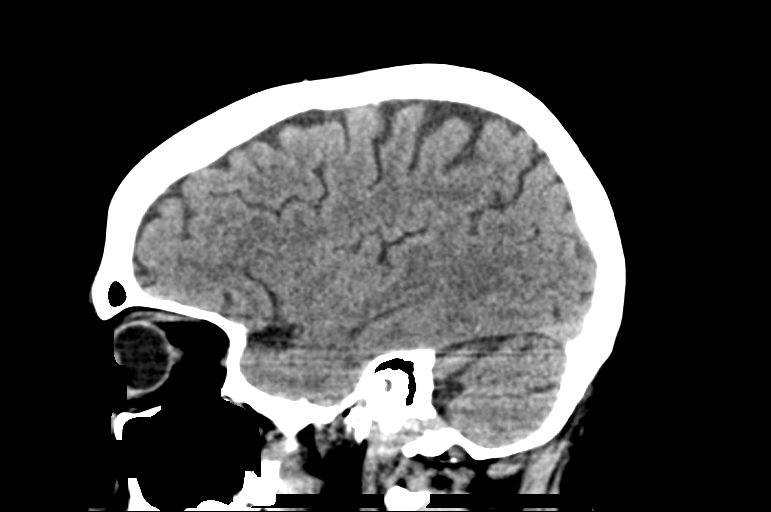
[im 33/66  brain]
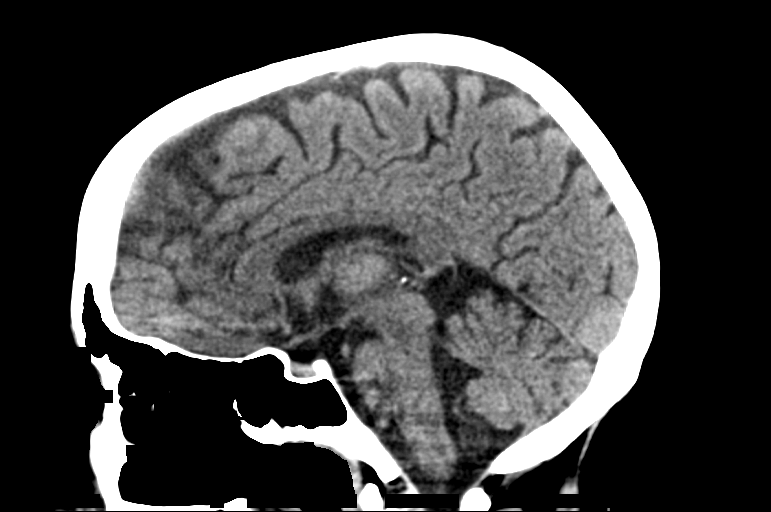
[im 44/66  brain]
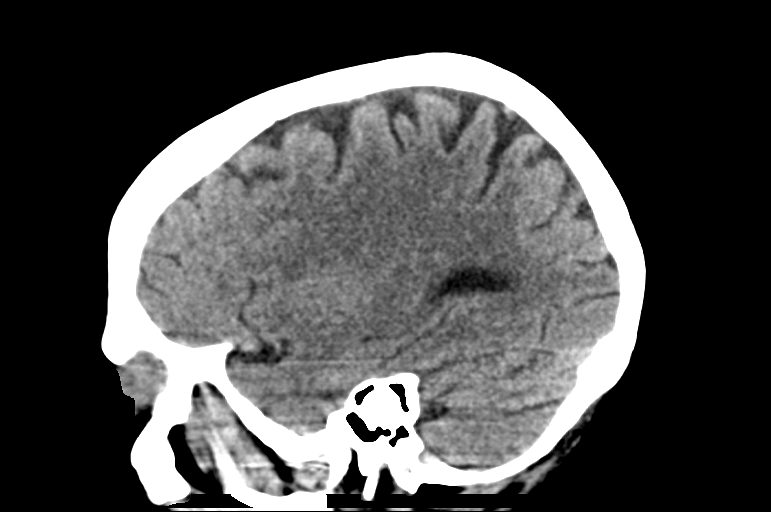

[14 of 47 positions shown; findings below may reference images not displayed]

FINDINGS: CT HEAD FINDINGS

BRAIN:
BRAIN
Patchy and confluent areas of decreased attenuation are noted
throughout the deep and periventricular white matter of the cerebral
hemispheres bilaterally, compatible with chronic microvascular
ischemic disease.

No evidence of large-territorial acute infarction. No parenchymal
hemorrhage. No mass lesion. No extra-axial collection.

No mass effect or midline shift. No hydrocephalus. Basilar cisterns
are patent.

Vascular: No hyperdense vessel.

Skull: No acute fracture or focal lesion.

Other: None.

CT MAXILLOFACIAL FINDINGS

Osseous: No fracture or mandibular dislocation. No destructive
process.

Sinuses/Orbits: Paranasal sinuses and mastoid air cells are clear.
The orbits are unremarkable.

Soft tissues: No large hematoma formation.
IMPRESSION: 1. No acute intracranial abnormality.
2.  No acute displaced fracture facial fracture.
3. Mild chronic microvascular ischemic changes, advanced finding for
patient's age.
